# Patient Record
Sex: Female | Born: 1995 | Hispanic: Yes | Marital: Single | Smoking: Never smoker
Health system: Southern US, Community
[De-identification: ages and names within clinical notes are randomized; demographics above are authoritative.]

## PROBLEM LIST (undated history)

## (undated) DIAGNOSIS — Z789 Other specified health status: Secondary | ICD-10-CM

## (undated) HISTORY — PX: NO PAST SURGERIES: SHX2092

## (undated) HISTORY — DX: Other specified health status: Z78.9

---

## 2018-11-22 ENCOUNTER — Telehealth: Payer: Self-pay | Admitting: Family Medicine

## 2018-11-22 NOTE — Telephone Encounter (Signed)
Patient called to get scheduled for an appointment. She was sent the link to get signed up for MyChart. She was also instructed to download the MyChart App. When offered assistance, she said she was sure she could do it without any help.

## 2018-11-28 ENCOUNTER — Telehealth: Payer: Self-pay | Admitting: Family Medicine

## 2018-11-28 ENCOUNTER — Ambulatory Visit (INDEPENDENT_AMBULATORY_CARE_PROVIDER_SITE_OTHER): Payer: Self-pay | Admitting: Emergency Medicine

## 2018-11-28 ENCOUNTER — Encounter: Payer: Self-pay | Admitting: Family Medicine

## 2018-11-28 ENCOUNTER — Other Ambulatory Visit: Payer: Self-pay

## 2018-11-28 DIAGNOSIS — Z789 Other specified health status: Secondary | ICD-10-CM

## 2018-11-28 DIAGNOSIS — Z3201 Encounter for pregnancy test, result positive: Secondary | ICD-10-CM

## 2018-11-28 LAB — POCT PREGNANCY, URINE: Preg Test, Ur: POSITIVE — AB

## 2018-11-28 NOTE — Telephone Encounter (Signed)
Attempted to patient about her appointment on 6/16 @ 2:30. No answer, someone picked up the phone but did not say anything. Attempted again and the same thing happened where no one said anything after picking up the call.

## 2018-11-28 NOTE — Progress Notes (Signed)
Pt here today for pregnancy test after having a positive pregnancy test at home. Pregnancy test today was positive. Pt unsure of LMP. OB ultrasound scheduled for 6/25 @ 10 am and 11 am. Pt made aware of appointments. Medications and allergies reconciled with the patient. A list of OTC medications safe to take during pregnancy given to the patient. Proof of pregnancy letter provided by the front office.

## 2018-12-04 ENCOUNTER — Inpatient Hospital Stay
Admission: RE | Admit: 2018-12-04 | Discharge: 2018-12-04 | Disposition: A | Discharge status: Home / Self Care | Payer: Self-pay | Source: Ambulatory Visit

## 2018-12-05 NOTE — Progress Notes (Signed)
Chart reviewed for nurse visit. Agree with plan of care.   Wende Mott, CNM

## 2018-12-07 ENCOUNTER — Other Ambulatory Visit: Payer: Self-pay

## 2018-12-07 ENCOUNTER — Encounter (HOSPITAL_COMMUNITY): Payer: Self-pay

## 2018-12-07 ENCOUNTER — Ambulatory Visit (HOSPITAL_COMMUNITY)
Admission: RE | Admit: 2018-12-07 | Discharge: 2018-12-07 | Disposition: A | Discharge status: Home / Self Care | Payer: Self-pay | Source: Ambulatory Visit

## 2018-12-07 ENCOUNTER — Ambulatory Visit (HOSPITAL_COMMUNITY)
Admission: RE | Admit: 2018-12-07 | Discharge: 2018-12-07 | Disposition: A | Discharge status: Home / Self Care | Payer: Self-pay | Source: Ambulatory Visit | Attending: Obstetrics and Gynecology | Admitting: Obstetrics and Gynecology

## 2018-12-07 DIAGNOSIS — Z789 Other specified health status: Secondary | ICD-10-CM | POA: Insufficient documentation

## 2018-12-11 ENCOUNTER — Encounter: Payer: Self-pay | Admitting: Emergency Medicine

## 2018-12-11 ENCOUNTER — Other Ambulatory Visit: Payer: Self-pay

## 2018-12-11 ENCOUNTER — Telehealth: Payer: Self-pay | Admitting: Emergency Medicine

## 2018-12-11 DIAGNOSIS — Z3400 Encounter for supervision of normal first pregnancy, unspecified trimester: Secondary | ICD-10-CM | POA: Insufficient documentation

## 2018-12-11 DIAGNOSIS — Z3401 Encounter for supervision of normal first pregnancy, first trimester: Secondary | ICD-10-CM

## 2018-12-11 MED ORDER — PROMETHAZINE HCL 25 MG PO TABS: 25.0000 mg | ORAL_TABLET | Freq: Four times a day (QID) | ORAL | 0 refills | Status: DC | PRN

## 2018-12-11 NOTE — Progress Notes (Signed)
I connected with  Gail [name] on 12/11/18 at 10:40 by telephone and verified that I am speaking with the correct person using two identifiers.   I discussed the limitations, risks, security and privacy concerns of performing an evaluation and management service by telephone and the availability of in person appointments. I also discussed with the patient that there may be a patient responsible charge related to this service. The patient expressed understanding and agreed to proceed.  New OB intake completed. Unable to connect with pt via mychart therefore visit completed via telephone. Patient reports that she has never had a pap smear before. Pt was informed that she can expect a pap smear and labs drawn at first prenatal visit. Patient is working on completing her paperwork for [employer]. Patient complains of nausea. Per protocol prescription for phenergan 25 mg was sent to pt pharmacy. Pt was informed of current visitor restrictions and mask requirements. Pt was also told that throughout her pregnancy there would be both virtual and face-to-face appointments.   [name], RN 12/11/2018  11:33 AM

## 2018-12-11 NOTE — Progress Notes (Signed)
I connected with  Gail [name] on 12/11/18 at 9:40 by telephone and verified that I am speaking with the correct person using two identifiers.   I discussed the limitations, risks, security and privacy concerns of performing an evaluation and management service by telephone and the availability of in person appointments. I also discussed with the patient that there may be a patient responsible charge related to this service. The patient expressed understanding and agreed to proceed.  Patient reports that she has never had a pap smear before. Pt was informed that she can expect a pap smear and labs drawn at first prenatal visit. Patient is working on completing her paperwork for [employer]. Patient complains of nausea. Per protocol prescription for phenergan 25 mg was sent to pt pharmacy.   [name], RN 12/11/2018  11:06 AM

## 2018-12-13 NOTE — Progress Notes (Signed)
I have reviewed this chart and agree with the RN/CMA assessment and management.    Rosealyn Little C Gabrielly Mccrystal, MD, FACOG Attending Physician, [facility]  

## 2019-01-08 ENCOUNTER — Other Ambulatory Visit: Payer: Self-pay

## 2019-01-08 ENCOUNTER — Ambulatory Visit (INDEPENDENT_AMBULATORY_CARE_PROVIDER_SITE_OTHER): Payer: Self-pay | Admitting: Obstetrics & Gynecology

## 2019-01-08 ENCOUNTER — Encounter: Payer: Self-pay | Admitting: Obstetrics & Gynecology

## 2019-01-08 VITALS — BP 109/86 | HR 91 | Temp 98.4°F | Wt 261.9 lb

## 2019-01-08 DIAGNOSIS — Z124 Encounter for screening for malignant neoplasm of cervix: Secondary | ICD-10-CM

## 2019-01-08 DIAGNOSIS — O99211 Obesity complicating pregnancy, first trimester: Secondary | ICD-10-CM

## 2019-01-08 DIAGNOSIS — O99210 Obesity complicating pregnancy, unspecified trimester: Secondary | ICD-10-CM | POA: Insufficient documentation

## 2019-01-08 DIAGNOSIS — Z3401 Encounter for supervision of normal first pregnancy, first trimester: Secondary | ICD-10-CM

## 2019-01-08 DIAGNOSIS — Z113 Encounter for screening for infections with a predominantly sexual mode of transmission: Secondary | ICD-10-CM

## 2019-01-08 DIAGNOSIS — Z3A11 11 weeks gestation of pregnancy: Secondary | ICD-10-CM

## 2019-01-08 NOTE — Progress Notes (Signed)
  Subjective:    Gail [name] is being seen today for her first obstetrical visit.  This is not a planned pregnancy. She is at [redacted]w[redacted]d gestation. Her obstetrical history is significant for obesity. Relationship with FOB: significant other, living together. Patient does intend to breast feed. Pregnancy history fully reviewed.  Patient reports no complaints.  Review of Systems:   Review of Systems  Objective:     BP 109/86   Pulse 91   Temp 98.4 F (36.9 [name])   Wt 261 lb 14.4 oz (118.8 kg)  Physical Exam  Exam Breathing, conversing, and ambulating normally Well nourished, well hydrated Latina, no apparent distress  Abd- benign Cervix- normal, no blood seen at all   Assessment:    Pregnancy: G1P0 Patient Active Problem List   Diagnosis Date Noted  . Obesity in pregnancy 01/08/2019  . Supervision of low-risk first pregnancy 12/11/2018       Plan:     Initial labs drawn. Prenatal vitamins. Problem list reviewed and updated. NIPS and horizon today Role of ultrasound in pregnancy discussed; fetal survey: ordered. Amniocentesis discussed: not indicated. Follow up in 4 weeks, virtual Rec 12 pound weight gain to decrease risk of stillbirth, etc. She has babyscripts and mychart Will get BP cuff when she get medicaid rec buy a scale Check hba1c     Gail [name] Gail [name] 01/08/2019

## 2019-01-09 LAB — OBSTETRIC PANEL, INCLUDING HIV
Antibody Screen: NEGATIVE
Basophils Absolute: 0.1 10*3/uL (ref 0.0–0.2)
Basos: 0 %
EOS (ABSOLUTE): 0.1 10*3/uL (ref 0.0–0.4)
Eos: 1 %
HIV Screen 4th Generation wRfx: NONREACTIVE
Hematocrit: 38.3 % (ref 34.0–46.6)
Hemoglobin: 12.8 g/dL (ref 11.1–15.9)
Hepatitis B Surface Ag: NEGATIVE
Immature Grans (Abs): 0.2 10*3/uL — ABNORMAL HIGH (ref 0.0–0.1)
Immature Granulocytes: 1 %
Lymphocytes Absolute: 3.1 10*3/uL (ref 0.7–3.1)
Lymphs: 21 %
MCH: 26.5 pg — ABNORMAL LOW (ref 26.6–33.0)
MCHC: 33.4 g/dL (ref 31.5–35.7)
MCV: 79 fL (ref 79–97)
Monocytes Absolute: 0.8 10*3/uL (ref 0.1–0.9)
Monocytes: 5 %
Neutrophils Absolute: 10.6 10*3/uL — ABNORMAL HIGH (ref 1.4–7.0)
Neutrophils: 72 %
Platelets: 473 10*3/uL — ABNORMAL HIGH (ref 150–450)
RBC: 4.83 x10E6/uL (ref 3.77–5.28)
RDW: 14.6 % (ref 11.7–15.4)
RPR Ser Ql: NONREACTIVE
Rh Factor: POSITIVE
Rubella Antibodies, IGG: 2.64 index (ref >0.99)
WBC: 14.9 10*3/uL — ABNORMAL HIGH (ref 3.4–10.8)

## 2019-01-09 LAB — HEMOGLOBIN A1C
Est. average glucose Bld gHb Est-mCnc: 111 mg/dL
Hgb A1c MFr Bld: 5.5 % (ref 4.8–5.6)

## 2019-01-10 ENCOUNTER — Encounter: Payer: Self-pay | Admitting: Obstetrics & Gynecology

## 2019-01-10 DIAGNOSIS — R87612 Low grade squamous intraepithelial lesion on cytologic smear of cervix (LGSIL): Secondary | ICD-10-CM | POA: Insufficient documentation

## 2019-01-10 LAB — CYTOLOGY - PAP
Chlamydia: NEGATIVE
Neisseria Gonorrhea: NEGATIVE

## 2019-01-10 LAB — CULTURE, OB URINE

## 2019-01-10 LAB — URINE CULTURE, OB REFLEX

## 2019-01-17 ENCOUNTER — Encounter: Payer: Self-pay | Admitting: *Deleted

## 2019-01-21 ENCOUNTER — Inpatient Hospital Stay (HOSPITAL_COMMUNITY)
Admission: EM | Admit: 2019-01-21 | Discharge: 2019-01-22 | Disposition: A | Discharge status: Home / Self Care | Payer: Self-pay | Attending: Obstetrics and Gynecology | Admitting: Obstetrics and Gynecology

## 2019-01-21 ENCOUNTER — Other Ambulatory Visit: Payer: Self-pay

## 2019-01-21 ENCOUNTER — Encounter (HOSPITAL_COMMUNITY): Payer: Self-pay | Admitting: Emergency Medicine

## 2019-01-21 DIAGNOSIS — Z3A13 13 weeks gestation of pregnancy: Secondary | ICD-10-CM

## 2019-01-21 DIAGNOSIS — O034 Incomplete spontaneous abortion without complication: Secondary | ICD-10-CM | POA: Insufficient documentation

## 2019-01-21 NOTE — ED Triage Notes (Addendum)
Per pt, she is [redacted] weeks pregnant, was having abdominal pains and then "I had to pee on my self."  Baby delivered at home.  Pt last saw her OBGYN in late July.

## 2019-01-21 NOTE — ED Provider Notes (Signed)
[facility] Emergency Department Provider Note MRN:  [id number]  Arrival date & time: 01/22/19     Chief Complaint   delivery   History of Present Illness   Gail [name] is a 23 y.o. year-old female with no pertinent past medical history presenting to the ED with chief complaint of delivery.  Patient explains that she is [redacted] weeks pregnant, first pregnancy.  She is been having some spotting during the pregnancy, was advised that this is normal.  This evening, shortly prior to arrival, she experienced sudden onset cramps, went to the bathroom, noticed some bleeding, and then suddenly passed a [name] fetus.  This caused her considerable distress and she drove here for evaluation.  Her pain is improved, currently mild, her bleeding is mild and slowing.  She otherwise denies fever, no chest pain or shortness of breath, no other complaints.  Review of Systems  A complete 10 system review of systems was obtained and all systems are negative except as noted in the HPI and PMH.   Patient's Health History    Past Medical History:  Diagnosis Date  . Medical history non-contributory   . No pertinent past medical history     Past Surgical History:  Procedure Laterality Date  . NO PAST SURGERIES      Family History  Problem Relation Age of Onset  . Miscarriages / [place] Mother   . Diabetes Maternal Aunt   . Diabetes Maternal Uncle   . Cancer Maternal Grandfather     Social History   Socioeconomic History  . Marital status: Single    Spouse name: Not on file  . Number of children: Not on file  . Years of education: Not on file  . Highest education level: Not on file  Occupational History  . Not on file  Social Needs  . Financial resource strain: Not on file  . Food insecurity    Worry: Not on file    Inability: Not on file  . Transportation needs    Medical: Not on file    Non-medical: Not on file  Tobacco Use  . Smoking status: Never Smoker   Substance and Sexual Activity  . Alcohol use: Not Currently    Frequency: Never  . Drug use: Never  . Sexual activity: Yes    Birth control/protection: None  Lifestyle  . Physical activity    Days per week: Not on file    Minutes per session: Not on file  . Stress: Not on file  Relationships  . Social Musicianconnections    Talks on phone: Not on file    Gets together: Not on file    Attends religious service: Not on file    Active member of club or organization: Not on file    Attends meetings of clubs or organizations: Not on file    Relationship status: Not on file  . Intimate partner violence    Fear of current or ex partner: Not on file    Emotionally abused: Not on file    Physically abused: Not on file    Forced sexual activity: Not on file  Other Topics Concern  . Not on file  Social History Narrative  . Not on file     Physical Exam  Vital Signs and Nursing Notes reviewed Vitals:   01/22/19 0000 01/22/19 0015  BP: 127/71 132/84  Pulse: (!) 104 93  Resp: 18 (!) 21  Temp:    SpO2: 96% 95%  CONSTITUTIONAL: Well-appearing, in moderate emotional distress NEURO:  Alert and oriented x 3, no focal deficits EYES:  eyes equal and reactive ENT/NECK:  no LAD, no JVD CARDIO: Tachycardic rate, well-perfused, normal S1 and S2 PULM:  CTAB no wheezing or rhonchi GI/GU:  normal bowel sounds, non-distended, non-tender; minimal vaginal bleeding MSK/SPINE:  No gross deformities, no edema SKIN:  no rash, atraumatic PSYCH:  Appropriate speech and behavior  Diagnostic and Interventional Summary    Labs Reviewed  CBC  BASIC METABOLIC PANEL  HCG, QUANTITATIVE, PREGNANCY  ABO/RH    No orders to display    Medications - No data to display   Procedures Critical Care  ED Course and Medical Decision Making  I have reviewed the triage vital signs and the nursing notes.  Pertinent labs & imaging results that were available during my care of the patient were reviewed by me and  considered in my medical decision making (see below for details).  Delivery of nonviable fetus in this otherwise healthy 23 year old female.  Abdomen is soft, bleeding appears controlled.  Unsure of gross status, will obtain labs, consult OB/GYN for recommendations regarding placental delivery and/or follow-up.  Discussed with OB, who recommends transfer to the [facility] for further evaluation.  Gail [name], [place] [email or web address]  Final Clinical Impressions(s) / ED Diagnoses     ICD-10-CM   1. Nonviable pregnancy  O02.89   2. Miscarriage  O03.9     ED Discharge Orders    None         Maudie Flakes, MD 01/22/19 Gail [name]

## 2019-01-22 ENCOUNTER — Encounter (HOSPITAL_COMMUNITY): Payer: Self-pay

## 2019-01-22 DIAGNOSIS — O039 Complete or unspecified spontaneous abortion without complication: Secondary | ICD-10-CM | POA: Insufficient documentation

## 2019-01-22 LAB — BASIC METABOLIC PANEL
Anion gap: 15 (ref 5–15)
BUN: 5 mg/dL — ABNORMAL LOW (ref 6–20)
CO2: 19 mmol/L — ABNORMAL LOW (ref 22–32)
Calcium: 9.9 mg/dL (ref 8.9–10.3)
Chloride: 102 mmol/L (ref 98–111)
Creatinine, Ser: 0.64 mg/dL (ref 0.44–1.00)
GFR calc Af Amer: >60 mL/min (ref >60)
GFR calc non Af Amer: >60 mL/min (ref >60)
Glucose, Bld: 91 mg/dL (ref 70–99)
Potassium: 3.9 mmol/L (ref 3.5–5.1)
Sodium: 136 mmol/L (ref 135–145)

## 2019-01-22 LAB — CBC
HCT: 42.9 % (ref 36.0–46.0)
Hemoglobin: 13.8 g/dL (ref 12.0–15.0)
MCH: 26.6 pg (ref 26.0–34.0)
MCHC: 32.2 g/dL (ref 30.0–36.0)
MCV: 82.7 fL (ref 80.0–100.0)
Platelets: 585 10*3/uL — ABNORMAL HIGH (ref 150–400)
RBC: 5.19 MIL/uL — ABNORMAL HIGH (ref 3.87–5.11)
RDW: 14.4 % (ref 11.5–15.5)
WBC: 23.7 10*3/uL — ABNORMAL HIGH (ref 4.0–10.5)
nRBC: 0 % (ref 0.0–0.2)

## 2019-01-22 LAB — HCG, QUANTITATIVE, PREGNANCY: hCG, Beta Chain, Quant, S: 31062 m[IU]/mL — ABNORMAL HIGH (ref <5)

## 2019-01-22 LAB — ABO/RH: ABO/RH(D): A POS

## 2019-01-22 MED ORDER — ONDANSETRON HCL 4 MG/2ML IJ SOLN
4.0000 mg | Freq: Once | INTRAMUSCULAR | Status: AC
  Administered 2019-01-22: 01:00:00 4 mg via INTRAVENOUS
  Filled 2019-01-22: qty 2

## 2019-01-22 MED ORDER — SODIUM CHLORIDE 0.9 % IV BOLUS
1000.0000 mL | Freq: Once | INTRAVENOUS | Status: AC
  Administered 2019-01-22: 1000 mL via INTRAVENOUS

## 2019-01-22 MED ORDER — FENTANYL CITRATE (PF) 100 MCG/2ML IJ SOLN
100.0000 ug | Freq: Once | INTRAMUSCULAR | Status: AC
  Administered 2019-01-22: 100 ug via INTRAVENOUS
  Filled 2019-01-22: qty 2

## 2019-01-22 MED ORDER — MISOPROSTOL 200 MCG PO TABS
600.0000 ug | ORAL_TABLET | Freq: Once | ORAL | Status: AC
  Administered 2019-01-22: 600 ug via BUCCAL
  Filled 2019-01-22: qty 3

## 2019-01-22 MED ORDER — FENTANYL CITRATE (PF) 100 MCG/2ML IJ SOLN
50.0000 ug | Freq: Once | INTRAMUSCULAR | Status: AC
  Administered 2019-01-22: 50 ug via INTRAVENOUS
  Filled 2019-01-22: qty 2

## 2019-01-22 NOTE — MAU Note (Signed)
Patient presents to MAU from [facility] ED. Patient reports having miscarriage at home. Patient states she was at home and started cramping. Patient "went to bathroom. Patient states she felt dizzy and threw up. She stated when she threw up she felt something "plop" and it was the baby."

## 2019-01-22 NOTE — MAU Provider Note (Signed)
History     CSN: [id number]  Arrival date and time: 01/21/19 2326   None     Chief Complaint  Patient presents with  . Miscarriage   Gail [name] is a 23 y.o. G1P0 at [redacted]w[redacted]d who receives care at [facility].  She presents today, as a transfer from [facility], for Vaginal Bleeding s/p Miscarriage.  Patient reports immense cramping and passing of large clots since delivery of fetus.  Patient states the cramping is in her abdomen and radiates to her back.  Patient requests something to drink.      OB History    Gravida  1   Para      Term      Preterm      AB      Living        SAB      TAB      Ectopic      Multiple      Live Births              Past Medical History:  Diagnosis Date  . Medical history non-contributory   . No pertinent past medical history     Past Surgical History:  Procedure Laterality Date  . NO PAST SURGERIES      Family History  Problem Relation Age of Onset  . Miscarriages / [place] Mother   . Diabetes Maternal Aunt   . Diabetes Maternal Uncle   . Cancer Maternal Grandfather     Social History   Tobacco Use  . Smoking status: Never Smoker  Substance Use Topics  . Alcohol use: Not Currently    Frequency: Never  . Drug use: Never    Allergies: No Known Allergies  Medications Prior to Admission  Medication Sig Dispense Refill Last Dose  . folic acid (FOLVITE) 938 MCG tablet Take 400 mcg by mouth daily.     . Prenatal Vit-Fe Fumarate-FA (PRENATAL MULTIVITAMIN) TABS tablet Take 1 tablet by mouth daily at 12 noon.     . promethazine (PHENERGAN) 25 MG tablet Take 1 tablet (25 mg total) by mouth every 6 (six) hours as needed for nausea or vomiting. 30 tablet 0     Review of Systems  Gastrointestinal: Positive for abdominal pain. Negative for nausea and vomiting.  Genitourinary: Positive for vaginal bleeding.   Physical Exam   Blood pressure 138/79, pulse 88, temperature 97.7 F (36.5 C), temperature source  Oral, resp. rate 16, height 5\' 3"  (1.6 m), weight 113.4 kg, SpO2 96 %.  Physical Exam  Constitutional: She is oriented to person, place, and time. She appears well-developed and well-nourished. She appears distressed.  HENT:  Head: Normocephalic and atraumatic.  Eyes: Conjunctivae are normal.  Neck: Normal range of motion.  Cardiovascular: Normal rate.  Respiratory: Effort normal.  GI: Soft.  Genitourinary:    Vaginal bleeding present.  There is bleeding in the vagina.    Genitourinary Comments: Sterile Speculum Exam: -Normal External Genitalia: Non tender,Small clots noted at introitus.  -Vaginal Vault: Large amt blood with large clot ~size of lemon noted and removed with ring forcep. Vault evacuated with laps x 5 and cervix noted with POC at os.  Slowly, gently teased out with ring forcep.  Appears to be placenta; will send to pathology. -Cervix:Pink, no lesions, cysts, or polyps.  Appears open. Scant to small amt active bleeding from os -Bimanual Exam:  Deferred   Musculoskeletal: Normal range of motion.  Neurological: She is alert and oriented to person, place, and  time.  Skin: Skin is warm and dry.  Psychiatric: She has a normal mood and affect. Her behavior is normal.    MAU Course  Procedures Results for orders placed or performed during the hospital encounter of 01/21/19 (from the past 24 hour(s))  CBC     Status: Abnormal   Collection Time: 01/21/19 11:55 PM  Result Value Ref Range   WBC 23.7 (H) 4.0 - 10.5 K/uL   RBC 5.19 (H) 3.87 - 5.11 MIL/uL   Hemoglobin 13.8 12.0 - 15.0 g/dL   HCT 16.142.9 09.636.0 - 04.546.0 %   MCV 82.7 80.0 - 100.0 fL   MCH 26.6 26.0 - 34.0 pg   MCHC 32.2 30.0 - 36.0 g/dL   RDW 40.914.4 81.111.5 - 91.415.5 %   Platelets 585 (H) 150 - 400 K/uL   nRBC 0.0 0.0 - 0.2 %  ABO/Rh     Status: None   Collection Time: 01/21/19 11:55 PM  Result Value Ref Range   ABO/RH(D)      A POS Performed at [facility], [address] [place], [place] [address]   hCG,  quantitative, pregnancy     Status: Abnormal   Collection Time: 01/21/19 11:55 PM  Result Value Ref Range   hCG, Beta Chain, Quant, S 31,062 (H) <5 mIU/mL    MDM [facility]  Assessment and Plan  23 year old, G1P0  Incomplete Abortion at 13.1weeks A Positive  -Exam findings discussed. -Informed that placenta appears to have passed; will send to pathology. -Discussed POC to include monitoring bleeding and if stable will discharge with follow up in 2 weeks at [facility] office.  -Dr. Alysia PennaErvin consulted for evaluation as questionable POC still noted at os and possible administration of cytotec. -Will give fentanyl 100mcg IV for pain. -Nurse instructed to clean patient up for improved monitoring of bleeding.   Cherre RobinsJessica L Weaver [place] MSN, CNM 01/22/2019, 1:24 AM    Reassessment (2:36 AM)  -Dr. Alysia PennaErvin to bedside to evaluate. *Able to remove pieces of placenta and trailing membranes. *Agrees with cytotec dosing. -Initial POC reiterated with patient. Informed of cytotec dosing to assist with bleeding. -Bleeding precautions given. -Questions addressed regarding restrictions; encouraged pelvic rest and tylenol dosing for pain/discomfort. -Informed that after cytotec dosing will give something to drink. -Give Cytotec 600mcg via buccal route.  -Will monitor bleeding and then plan for discharge. -Message sent to [facility] admin staff for modification of ROB appt on Aug 24 to SAB follow up.   Reassessment (3:31 AM)  -Bleeding remains stable. No clots noted on nurse exam. -Encouraged to call or return to MAU if symptoms worsen or with the onset of new symptoms. -Discharged to home in stable condition.  Cherre RobinsJessica L Guerry [place] MSN, CNM

## 2019-01-22 NOTE — Discharge Instructions (Signed)
Miscarriage °A miscarriage is the loss of an unborn baby (fetus) before the 20th week of pregnancy. Most miscarriages happen during the first 3 months of pregnancy. Sometimes, a miscarriage can happen before a woman knows that she is pregnant. °Having a miscarriage can be an emotional experience. If you have had a miscarriage, talk with your health care provider about any questions you may have about miscarrying, the grieving process, and your plans for future pregnancy. °What are the causes? °A miscarriage may be caused by: °· Problems with the genes or chromosomes of the fetus. These problems make it impossible for the baby to develop normally. They are often the result of random errors that occur early in the development of the baby, and are not passed from parent to child (not inherited). °· Infection of the cervix or uterus. °· Conditions that affect hormone balance in the body. °· Problems with the cervix, such as the cervix opening and thinning before pregnancy is at term (cervical insufficiency). °· Problems with the uterus. These may include: °? A uterus with an abnormal shape. °? Fibroids in the uterus. °? Congenital abnormalities. These are problems that were present at birth. °· Certain medical conditions. °· Smoking, drinking alcohol, or using drugs. °· Injury (trauma). °In many cases, the cause of a miscarriage is not known. °What are the signs or symptoms? °Symptoms of this condition include: °· Vaginal bleeding or spotting, with or without cramps or pain. °· Pain or cramping in the abdomen or lower back. °· Passing fluid, tissue, or blood clots from the vagina. °How is this diagnosed? °This condition may be diagnosed based on: °· A physical exam. °· Ultrasound. °· Blood tests. °· Urine tests. °How is this treated? °Treatment for a miscarriage is sometimes not necessary if you naturally pass all the tissue that was in your uterus. If necessary, this condition may be treated with: °· Dilation and  curettage (D&C). This is a procedure in which the cervix is stretched open and the lining of the uterus (endometrium) is scraped. This is done only if tissue from the fetus or placenta remains in the body (incomplete miscarriage). °· Medicines, such as: °? Antibiotic medicine, to treat infection. °? Medicine to help the body pass any remaining tissue. °? Medicine to reduce (contract) the size of the uterus. These medicines may be given if you have a lot of bleeding. °If you have Rh negative blood and your baby was Rh positive, you will need a shot of a medicine called Rh immunoglobulinto protect your future babies from Rh blood problems. "Rh-negative" and "Rh-positive" refer to whether or not the blood has a specific protein found on the surface of red blood cells (Rh factor). °Follow these instructions at home: °Medicines ° °· Take over-the-counter and prescription medicines only as told by your health care provider. °· If you were prescribed antibiotic medicine, take it as told by your health care provider. Do not stop taking the antibiotic even if you start to feel better. °· Do not take NSAIDs, such as aspirin and ibuprofen, unless they are approved by your health care provider. These medicines can cause bleeding. °Activity °· Rest as directed. Ask your health care provider what activities are safe for you. °· Have someone help with home and family responsibilities during this time. °General instructions °· Keep track of the number of sanitary pads you use each day and how soaked (saturated) they are. Write down this information. °· Monitor the amount of tissue or blood clots that   you pass from your vagina. Save any large amounts of tissue for your health care provider to examine.  Do not use tampons, douche, or have sex until your health care provider approves.  To help you and your partner with the process of grieving, talk with your health care provider or seek counseling.  When you are ready, meet with  your health care provider to discuss any important steps you should take for your health. Also, discuss steps you should take to have a healthy pregnancy in the future.  Keep all follow-up visits as told by your health care provider. This is important. Where to find more information  The American Congress of Obstetricians and Gynecologists: www.acog.org  U.[place] Department of Health and Programmer, systems of Womens Health: [place] Contact a health care provider if:  You have a fever or chills.  You have a foul smelling vaginal discharge.  You have more bleeding instead of less. Get help right away if:  You have severe cramps or pain in your back or abdomen.  You pass blood clots or tissue from your vagina that is walnut-sized or larger.  You soak more than 1 regular sanitary pad in an hour.  You become light-headed or weak.  You pass out.  You have feelings of sadness that take over your thoughts, or you have thoughts of hurting yourself. Summary  Most miscarriages happen in the first 3 months of pregnancy. Sometimes miscarriage happens before a woman even knows that she is pregnant.  Follow your health care provider's instruction for home care. Keep all follow-up appointments.  To help you and your partner with the process of grieving, talk with your health care provider or seek counseling. This information is not intended to replace advice given to you by your health care provider. Make sure you discuss any questions you have with your health care provider. Document Released: 11/24/2000 Document Revised: 09/22/2018 Document Reviewed: 07/06/2016 Elsevier Patient Education  2020 [place]. Complicated Grief Grief is a normal response to the death of someone close to you. Feelings of fear, anger, and guilt can affect almost everyone who loses a loved one. It is also common to have symptoms of depression while you are grieving. These include problems with sleep,  loss of appetite, and lack of energy. They may last for weeks or months after a loss. Complicated grief is different from normal grief or depression. Normal grieving involves sadness and feelings of loss, but those feelings get better and heal over time. Complicated grief is a severe type of grief that lasts for a long time, usually for several months to a year or longer. It interferes with your ability to function normally. Complicated grief may require treatment from a mental health care provider. What are the causes? The cause of this condition is not known. It is not clear why some people continue to struggle with grief and others do not. What increases the risk? You are more likely to develop this condition if:  The death of your loved one was sudden or unexpected.  The death of your loved one was due to a violent event.  Your loved one died from suicide.  Your loved one was a child or a young person.  You were very close to your loved one, or you were dependent on him or her.  You have a history of depression or anxiety. What are the signs or symptoms? Symptoms of this condition include:  Feeling disbelief or having a lack of  emotion (numbness).  Being unable to enjoy good memories of your loved one.  Needing to avoid anything or anyone that reminds you of your loved one.  Being unable to stop thinking about the death.  Feeling intense anger or guilt.  Feeling alone and hopeless.  Feeling that your life is meaningless and empty.  Losing the desire to move on with your life. How is this diagnosed? This condition may be diagnosed based on:  Your symptoms. Complicated grief will be diagnosed if you have ongoing symptoms of grief for 6-12 months or longer.  The effect of symptoms on your life. You may be diagnosed with this condition if your symptoms are interfering with your ability to live your life. Your health care provider may recommend that you see a mental health  care provider. Many symptoms of depression are similar to the symptoms of complicated grief. It is important to be evaluated for complicated grief along with other mental health conditions. How is this treated? This condition is most commonly treated with talk therapy. This therapy is offered by a mental health specialist (psychiatrist). During therapy:  You will learn healthy ways to cope with the loss of your loved one.  Your mental health care provider may recommend antidepressant medicines. Follow these instructions at home: Lifestyle   Take care of yourself. ? Eat on a regular basis, and maintain a healthy diet. Eat plenty of fruits, vegetables, lean protein, and whole grains. ? Try to get some exercise each day. Aim for 30 minutes of exercise on most days of the week. ? Keep a consistent sleep schedule. Try to get 8 or more hours of sleep each night. ? Start doing the things that you used to enjoy.  Do not use drugs or alcohol to ease your symptoms.  Spend time with friends and loved ones. General instructions  Take over-the-counter and prescription medicines only as told by your health care provider.  Consider joining a grief (bereavement) support group to help you deal with your loss.  Keep all follow-up visits as told by your health care provider. This is important. Contact a health care provider if:  Your symptoms prevent you from functioning normally.  Your symptoms do not get better with treatment. Get help right away if:  You have serious thoughts about hurting yourself or someone else.  You have suicidal feelings. If you ever feel like you may hurt yourself or others, or have thoughts about taking your own life, get help right away. You can go to your nearest emergency department or call:  Your local emergency services (911 in the [place]  A suicide crisis helpline, such as the National Suicide Prevention Lifeline at (857)115-93301-(201)315-3296. This is open 24 hours a  day. Summary  Complicated grief is a severe type of grief that lasts for a long time. This grief is not likely to go away on its own. Get the help you need.  Some griefs are more difficult than others and can cause this condition. You may need a certain type of treatment to help you recover if the loss of your loved one was sudden, violent, or due to suicide.  You may feel guilty about moving on with your life. Getting help does not mean that you are forgetting your loved one. It means that you are taking care of yourself.  Complicated grief is best treated with talk therapy. Medicines may also be prescribed.  Seek the help you need, and find support that will help you recover.  This information is not intended to replace advice given to you by your health care provider. Make sure you discuss any questions you have with your health care provider. Document Released: 05/31/2005 Document Revised: 05/13/2017 Document Reviewed: 03/16/2017 Elsevier Patient Education  2020 [employer]

## 2019-01-22 NOTE — ED Notes (Signed)
Informed provider that pt is vomiting and has blood clots. Provider bedside.

## 2019-01-24 NOTE — Progress Notes (Signed)
POC idenitifed

## 2019-01-25 ENCOUNTER — Telehealth: Payer: Self-pay

## 2019-01-25 NOTE — Telephone Encounter (Signed)
Pt called Nurse line this am @ 10:37a regarding cont. Bleeding, blood clots, a lot of pain after ER visit for SAB. Pt did not answer, left VM stating medication given at er visit is causing all the symptoms & to use either Ibuprofen ar Tylenol for Pain. Advised to call back if have further questions.

## 2019-01-26 ENCOUNTER — Encounter (HOSPITAL_COMMUNITY): Payer: Self-pay

## 2019-01-26 ENCOUNTER — Other Ambulatory Visit: Payer: Self-pay

## 2019-01-26 ENCOUNTER — Inpatient Hospital Stay (HOSPITAL_COMMUNITY): Payer: Self-pay

## 2019-01-26 ENCOUNTER — Inpatient Hospital Stay (HOSPITAL_COMMUNITY)
Admission: EM | Admit: 2019-01-26 | Discharge: 2019-01-26 | Disposition: A | Discharge status: Home / Self Care | Payer: Self-pay | Attending: Obstetrics & Gynecology | Admitting: Obstetrics & Gynecology

## 2019-01-26 DIAGNOSIS — O039 Complete or unspecified spontaneous abortion without complication: Secondary | ICD-10-CM | POA: Insufficient documentation

## 2019-01-26 DIAGNOSIS — Z3A13 13 weeks gestation of pregnancy: Secondary | ICD-10-CM | POA: Insufficient documentation

## 2019-01-26 LAB — URINALYSIS, ROUTINE W REFLEX MICROSCOPIC
Bilirubin Urine: NEGATIVE
Glucose, UA: NEGATIVE mg/dL
Ketones, ur: NEGATIVE mg/dL
Nitrite: NEGATIVE
Protein, ur: NEGATIVE mg/dL
Specific Gravity, Urine: 1.021 (ref 1.005–1.030)
pH: 5 (ref 5.0–8.0)

## 2019-01-26 MED ORDER — MISOPROSTOL 200 MCG PO TABS
800.0000 ug | ORAL_TABLET | Freq: Once | ORAL | Status: AC
  Administered 2019-01-26: 800 ug via BUCCAL
  Filled 2019-01-26: qty 4

## 2019-01-26 NOTE — ED Notes (Signed)
Called [facility] to inquire if this pt should stay in ER or [facility]- [facility] accepts pt.

## 2019-01-26 NOTE — [facility] Provider Note (Signed)
Chief Complaint: Abdominal Pain and Vaginal Bleeding   First Provider Initiated Contact with Patient 01/26/19 0912     SUBJECTIVE HPI: Gail [name] is a 23 y.o. G1P0 at 6440w5d who presents [facility] reporting vaginal bleeding & abdominal pain. Was seen in [facility] Sunday night for a miscarriage. She reports delivering the baby at home and in [facility] placenta was removed. Patient was given dose of cytotec before discharge. States she has continued to have bright red bleeding & passing some small clots. Is not saturating pads. Also reports lower abdominal cramping, low back pain, & rectal pressure. No fever/chills.   Location: abdomen/back Quality: cramping Severity: 4/10 on pain scale Duration: 4 days Timing: intermittent Modifying factors: not improved with tylenol Associated signs and symptoms: vaginal bleeding  Past Medical History:  Diagnosis Date  . No pertinent past medical history    OB History  Gravida Para Term Preterm AB Living  1            SAB TAB Ectopic Multiple Live Births               # Outcome Date GA Lbr Len/2nd Weight Sex Delivery Anes PTL Lv  1 Current            Past Surgical History:  Procedure Laterality Date  . NO PAST SURGERIES     Social History   Socioeconomic History  . Marital status: Single    Spouse name: Not on file  . Number of children: Not on file  . Years of education: Not on file  . Highest education level: Not on file  Occupational History  . Not on file  Social Needs  . Financial resource strain: Not on file  . Food insecurity    Worry: Not on file    Inability: Not on file  . Transportation needs    Medical: Not on file    Non-medical: Not on file  Tobacco Use  . Smoking status: Never Smoker  . Smokeless tobacco: Never Used  Substance and Sexual Activity  . Alcohol use: Not Currently    Frequency: Never  . Drug use: Never  . Sexual activity: Yes    Birth control/protection: None  Lifestyle  . Physical  activity    Days per week: Not on file    Minutes per session: Not on file  . Stress: Not on file  Relationships  . Social Musicianconnections    Talks on phone: Not on file    Gets together: Not on file    Attends religious service: Not on file    Active member of club or organization: Not on file    Attends meetings of clubs or organizations: Not on file    Relationship status: Not on file  . Intimate partner violence    Fear of current or ex partner: Not on file    Emotionally abused: Not on file    Physically abused: Not on file    Forced sexual activity: Not on file  Other Topics Concern  . Not on file  Social History Narrative  . Not on file   Family History  Problem Relation Age of Onset  . Miscarriages / [place] Mother   . Diabetes Maternal Aunt   . Diabetes Maternal Uncle   . Cancer Maternal Grandfather    No current facility-administered medications on file prior to encounter.    No current outpatient medications on file prior to encounter.   No Known Allergies  I have reviewed  patient's Past Medical Hx, Surgical Hx, Family Hx, Social Hx, medications and allergies.   Review of Systems  Constitutional: Negative.   Gastrointestinal: Positive for abdominal pain.  Genitourinary: Positive for vaginal bleeding.    OBJECTIVE Patient Vitals for the past 24 hrs:  BP Temp Temp src Pulse Resp Height Weight  01/26/19 1322 (!) 143/92 - - - 18 - -  01/26/19 0900 130/90 - - - - - -  01/26/19 0819 (!) 149/84 98.6 F (37 C) Oral 87 18 5\' 3"  (1.6 m) 119.8 kg   Constitutional: Well-developed, well-nourished female in no acute distress.  Cardiovascular: normal rate & rhythm, no murmur Respiratory: normal rate and effort. Lung sounds clear throughout GI: Abd soft, non-tender, Pos BS x 4. No guarding or rebound tenderness MS: Extremities nontender, no edema, normal ROM Neurologic: Alert and oriented x 4.     LAB RESULTS Results for orders placed or performed during the  hospital encounter of 01/26/19 (from the past 24 hour(s))  Urinalysis, Routine w reflex microscopic     Status: Abnormal   Collection Time: 01/26/19 11:02 AM  Result Value Ref Range   Color, Urine YELLOW YELLOW   APPearance HAZY (A) CLEAR   Specific Gravity, Urine 1.021 1.005 - 1.030   pH 5.0 5.0 - 8.0   Glucose, UA NEGATIVE NEGATIVE mg/dL   Hgb urine dipstick LARGE (A) NEGATIVE   Bilirubin Urine NEGATIVE NEGATIVE   Ketones, ur NEGATIVE NEGATIVE mg/dL   Protein, ur NEGATIVE NEGATIVE mg/dL   Nitrite NEGATIVE NEGATIVE   Leukocytes,Ua SMALL (A) NEGATIVE   RBC / HPF 11-20 0 - 5 RBC/hpf   WBC, UA 21-50 0 - 5 WBC/hpf   Bacteria, UA RARE (A) NONE SEEN   Squamous Epithelial / LPF 6-10 0 - 5   Mucus PRESENT    Ca Oxalate Crys, UA PRESENT     IMAGING US Pelvic Complete With Transvaginal  Result Date: 01/26/2019 CLINICAL DATA:  Continued bleeding after spontaneous abortion five days ago. EXAM: TRANSABDOMINAL AND TRANSVAGINAL ULTRASOUND OF PELVIS TECHNIQUE: Both transabdominal and transvaginal ultrasound examinations of the pelvis were performed. Transabdominal technique was performed for global imaging of the pelvis including uterus, ovaries, adnexal regions, and pelvic cul-de-sac. It was necessary to proceed with endovaginal exam following the transabdominal exam to visualize the endometrium and ovaries. COMPARISON:  None FINDINGS: Uterus Measurements: 9.4 x 4.7 x 6.6 cm = volume: 152.7 mL. No fibroids or other mass visualized. Endometrium Thickness: 16 mm.  Thickened heterogeneous vascular endometrium. Right ovary Measurements: 2.4 x 1.9 x 2.2 cm = volume: 5.3 mL. Normal appearance/no adnexal mass. Left ovary Measurements: 2.7 x 2.3 x 2.1 cm = volume: 6.7 mL. Normal appearance/no adnexal mass. Other findings No abnormal free fluid. IMPRESSION: 1. Thickened heterogeneous vascular endometrium measuring 16 mm in thickness is identified. Sonographic findings are compatible with retained products of  conception in the correct clinical setting. Electronically Signed   By: Dorise Bullion III M.D   On: 01/26/2019 10:53    [facility] COURSE Orders Placed This Encounter  Procedures  . Culture, OB Urine  . US PELVIC COMPLETE WITH TRANSVAGINAL  . Urinalysis, Routine w reflex microscopic  . Discharge patient   Meds ordered this encounter  Medications  . misoprostol (CYTOTEC) tablet 800 mcg    MDM Ultrasound shows ET of 16 mm. Some heterogeneous vascular endometrium. C/w Dr. Harolyn Rutherford, will offer dose of cytotec.   RH positive  Patient given cytotec prior to discharge. Discussed results of ultrasound & reasons to  return to [facility]U. Pt has SAB f/u appointment scheduled for 8/24  ASSESSMENT 1. Miscarriage     PLAN Discharge home in stable condition. Bleeding/infection precautions Pelvic rest Urine culture pending  Follow-up Information    [facility] Follow up.   Specialty: Obstetrics and Gynecology Why: return for worsening symptoms Contact information: [address] [id number] [place] [id number] [phone]         Allergies as of 01/26/2019   No Known Allergies     Medication List    You have not been prescribed any medications.      Judeth HornLawrence, Joban Colledge, NP 01/26/2019  4:16 PM

## 2019-01-26 NOTE — Progress Notes (Signed)
I offered grief support to [place] as she continues to process the loss of her baby.  She shared about the delivery and the days that have followed made good use of the time to ask questions about typical grief.  This was a first baby for her and her SO and a first grandchild for her parents.  I provided her with resources and information for follow up grief support.  [place], Bcc Pager, [phone] 12:20 PM

## 2019-01-26 NOTE — MAU Note (Signed)
miscarriage Sunday night (at home) 13 weeks (brought fetus in with her and because providers could not give her an answer of why the miscarriage she took the fetus back home.(fetus is in a box at home)  Came here to [facility] Sunday night d/c @ 4am Monday morning. Still in a Lot of pain since miscarriage (rectual pressure, back pain, abd pain, pelvic pain) still bleeding and was neve offered an U/S. Would like to have U/S to make sure she have passed everything (dont want anything to be left inside to cause infection and mess up chance of getting pregnant again. Want to be exam to make sure she have passed everything. Pt. Is very upset.

## 2019-01-26 NOTE — Discharge Instructions (Signed)
Return to care  °· If you have heavier bleeding that soaks through more that 2 pads per hour for an hour or more °· If you bleed so much that you feel like you might pass out or you do pass out °· If you have significant abdominal pain that is not improved with Tylenol  °· If you develop a fever > 100.5 ° ° ° °Managing Pregnancy Loss °Pregnancy loss can happen any time during a pregnancy. Often the cause is not known. It is rarely because of anything you did. Pregnancy loss in early pregnancy (during the first trimester) is called a miscarriage. This type of pregnancy loss is the most common. Pregnancy loss that happens after 20 weeks of pregnancy is called fetal demise if the baby's heart stops beating before birth. Fetal demise is much less common. Some women experience spontaneous labor shortly after fetal demise resulting in a stillborn birth (stillbirth). °Any pregnancy loss can be devastating. You will need to recover both physically and emotionally. Most women are able to get pregnant again after a pregnancy loss and deliver a healthy baby. °How to manage emotional recovery ° °Pregnancy loss is very hard emotionally. You may feel many different emotions while you grieve. You may feel sad and angry. You may also feel guilty. It is normal to have periods of crying. Emotional recovery can take longer than physical recovery. It is different for everyone. °Taking these steps can help you in managing this loss: °· Remember that it is unlikely you did anything to cause the pregnancy loss. °· Share your thoughts and feelings with friends, family, and your partner. Remember that your partner is also recovering emotionally. °· Make sure you have a good support system. Do not spend too much time alone. °· Meet with a pregnancy loss counselor or join a pregnancy loss support group. °· Get enough sleep and eat a healthy diet. Return to regular exercise when you have recovered physically. °· Do not use drugs or alcohol to  manage your emotions. °· Consider seeing a mental health professional to help you recover emotionally. °· Ask a friend or loved one to help you decide what to do with any clothing and nursery items you received for your baby. °In the case of a stillbirth, many women benefit from taking additional steps in the grieving process. You may want to: °· Hold your baby after the birth. °· Name your baby. °· Request a birth certificate. °· Create a keepsake such as handprints or footprints. °· Dress your baby and have a picture taken. °· Make funeral arrangements. °· Ask for a baptism or blessing. °Hospitals have staff members who can help you with all these arrangements. °How to recognize emotional stress °It is normal to have emotional stress after a pregnancy loss. But emotional stress that lasts a long time or becomes severe requires treatment. Watch out for these signs of severe emotional stress: °· Sadness, anger, or guilt that is not going away and is interfering with your normal activities. °· Relationship problems that have occurred or gotten worse since the pregnancy loss. °· Signs of depression that last longer than 2 weeks. These may include: °? Sadness. °? Anxiety. °? Hopelessness. °? Loss of interest in activities you enjoy. °? Inability to concentrate. °? Trouble sleeping or sleeping too much. °? Loss of appetite or overeating. °? Thoughts of death or of hurting yourself. °Follow these instructions at home: °· Take over-the-counter and prescription medicines only as told by your health care   provider. °· Rest at home until your energy level returns. Return to your normal activities as told by your health care provider. Ask your health care provider what activities are safe for you. °· When you are ready, meet with your health care provider to discuss steps to take for a future pregnancy. °· Keep all follow-up visits as told by your health care provider. This is important. °Where to find support °· To help you  and your partner with the process of grieving, talk with your health care provider or seek counseling. °· Consider meeting with others who have experienced pregnancy loss. Ask your health care provider about support groups and resources. °Where to find more information °· [place] Department of Health and Human Services Office on Women's Health: www.womenshealth.gov °· American Pregnancy Association: www.americanpregnancy.org °Contact a health care provider if: °· You continue to experience grief, sadness, or lack of motivation for everyday activities, and those feelings do not improve over time. °· You are struggling to recover emotionally, especially if you are using alcohol or substances to help. °Get help right away if: °· You have thoughts of hurting yourself or others. °If you ever feel like you may hurt yourself or others, or have thoughts about taking your own life, get help right away. You can go to your nearest emergency department or call: °· Your local emergency services (911 in the [place] °· A suicide crisis helpline, such as the National Suicide Prevention Lifeline at 1-800-273-8255. This is open 24 hours a day. °Summary °· Any pregnancy loss can be difficult physically and emotionally. °· You may experience many different emotions while you grieve. Emotional recovery can last longer than physical recovery. °· It is normal to have emotional stress after a pregnancy loss. But emotional stress that lasts a long time or becomes severe requires treatment. °· See your health care provider if you are struggling emotionally after a pregnancy loss. °This information is not intended to replace advice given to you by your health care provider. Make sure you discuss any questions you have with your health care provider. °Document Released: 08/11/2017 Document Revised: 09/20/2018 Document Reviewed: 08/11/2017 °Elsevier Patient Education © 2020 [employer]. ° °

## 2019-01-27 LAB — CULTURE, OB URINE

## 2019-02-05 ENCOUNTER — Ambulatory Visit (INDEPENDENT_AMBULATORY_CARE_PROVIDER_SITE_OTHER): Payer: Self-pay | Admitting: Family Medicine

## 2019-02-05 ENCOUNTER — Encounter: Payer: Self-pay | Admitting: Family Medicine

## 2019-02-05 ENCOUNTER — Other Ambulatory Visit: Payer: Self-pay

## 2019-02-05 ENCOUNTER — Telehealth: Payer: Self-pay | Admitting: Obstetrics & Gynecology

## 2019-02-05 VITALS — BP 137/96 | HR 96 | Wt 276.0 lb

## 2019-02-05 DIAGNOSIS — O039 Complete or unspecified spontaneous abortion without complication: Secondary | ICD-10-CM

## 2019-02-05 NOTE — Progress Notes (Signed)
9th-17th bled  19th bled after orgasm (no penetration)

## 2019-02-05 NOTE — Progress Notes (Signed)
   Subjective:    Patient ID: Gail [name], female    DOB: Oct 17, 1995, 23 y.o.   MRN: [id number]  HPI  Patient seen for follow up after SAB at 13-14 weeks. Had complete SAB. Bleeding has stopped. Grieving, but able to take care of self. No HI/SI.  Review of Systems     Objective:   Physical Exam Vitals signs reviewed.  Constitutional:      Appearance: Normal appearance.  HENT:     Head: Normocephalic and atraumatic.  Cardiovascular:     Rate and Rhythm: Normal rate and regular rhythm.     Pulses: Normal pulses.     Heart sounds: Normal heart sounds.  Pulmonary:     Effort: Pulmonary effort is normal.     Breath sounds: Normal breath sounds.  Skin:    Capillary Refill: Capillary refill takes less than 2 seconds.  Neurological:     Mental Status: She is alert.       Assessment & Plan:  1. SAB (spontaneous abortion) Discussed not getting pregnant over next 3 months. Continue PNV.

## 2019-02-06 NOTE — BH Specialist Note (Signed)
Pt did not arrive to video visit and did not answer the phone; Left HIPPA-compliant message to call back [name] from [facility] [employer] [facility] at [phone], and left MyChart message for patient.   [place] via Telemedicine Video Visit  02/06/2019 Gail [name] [phone]  [name]

## 2019-02-07 ENCOUNTER — Encounter: Payer: Self-pay | Admitting: Family Medicine

## 2019-02-07 ENCOUNTER — Other Ambulatory Visit: Payer: Self-pay

## 2019-02-07 ENCOUNTER — Telehealth: Payer: Self-pay | Admitting: Clinical

## 2019-02-07 DIAGNOSIS — Z91199 Patient's noncompliance with other medical treatment and regimen due to unspecified reason: Secondary | ICD-10-CM

## 2019-02-07 DIAGNOSIS — Z5329 Procedure and treatment not carried out because of patient's decision for other reasons: Secondary | ICD-10-CM

## 2019-03-07 ENCOUNTER — Ambulatory Visit (HOSPITAL_COMMUNITY): Payer: Self-pay

## 2019-11-28 ENCOUNTER — Ambulatory Visit (INDEPENDENT_AMBULATORY_CARE_PROVIDER_SITE_OTHER)
Admission: RE | Admit: 2019-11-28 | Discharge: 2019-11-28 | Disposition: A | Discharge status: Home / Self Care | Payer: Self-pay | Source: Ambulatory Visit

## 2019-11-28 ENCOUNTER — Other Ambulatory Visit: Payer: Self-pay

## 2019-11-28 DIAGNOSIS — H1031 Unspecified acute conjunctivitis, right eye: Secondary | ICD-10-CM

## 2019-11-28 MED ORDER — POLYMYXIN B-TRIMETHOPRIM 10000-0.1 UNIT/ML-% OP SOLN: 1.0000 [drp] | Freq: Four times a day (QID) | OPHTHALMIC | 0 refills | Status: AC

## 2019-11-28 NOTE — ED Provider Notes (Signed)
Virtual Visit via Video Note:  Gail [name]  initiated request for Telemedicine visit with [facility] team. I connected with Gail [name]  on 11/28/2019 at 9:48 AM  for a synchronized telemedicine visit using a video enabled HIPPA compliant telemedicine application. I verified that I am speaking with Gail [name]  using two identifiers. Mickie Bail, NP  was physically located in a [facility] Urgent care site and Manie Bealer was located at a different location.   The limitations of evaluation and management by telemedicine as well as the availability of in-person appointments were discussed. Patient was informed that she  may incur a bill ( including co-pay) for this virtual visit encounter. Gail [name]  expressed understanding and gave verbal consent to proceed with virtual visit.     History of Present Illness:Gail [name]  is a 24 y.o. female presents for evaluation of right eye crusting, redness, and drainage x 1 day.  She got lash extensions 3 days ago but she has had these before.  No injury.  No eye pain or changes in vision.  No fever or chills.  She denies current pregnancy or breastfeeding.      No Known Allergies   Past Medical History:  Diagnosis Date  . No pertinent past medical history      Social History   Tobacco Use  . Smoking status: Never Smoker  . Smokeless tobacco: Never Used  Vaping Use  . Vaping Use: Never used  Substance Use Topics  . Alcohol use: Not Currently  . Drug use: Never    ROS: as stated in HPI.  All other systems reviewed and negative.       Observations/Objective: Physical Exam  VITALS: Patient denies fever. GENERAL: Alert, appears well and in no acute distress. HEENT: Atraumatic. Oral mucosa appears moist.  Right eye has crusting along lower lid and inner canthus.  NECK: Normal movements of the head and neck. CARDIOPULMONARY: No increased WOB.  Speaking in clear sentences. I:E ratio WNL.  MS: Moves all visible extremities without noticeable abnormality. PSYCH: Pleasant and cooperative, well-groomed. Speech normal rate and rhythm. Affect is appropriate. Insight and judgement are appropriate. Attention is focused, linear, and appropriate.  NEURO: CN grossly intact. Oriented as arrived to appointment on time with no prompting. Moves both UE equally.  SKIN: No obvious lesions, wounds, erythema, or cyanosis noted on face or hands.   Assessment and Plan:    ICD-10-CM   1. Acute bacterial conjunctivitis of right eye  H10.31        Follow Up Instructions: Treating with Polytrim eyedrops.  Education provided about bacterial conjunctivitis.  Instructed patient to go to the ED if she has acute eye pain or changes in her vision.  Instructed her to follow-up with her eye care provider if her symptoms are not improving.  Patient agrees to plan of care.    I discussed the assessment and treatment plan with the patient. The patient was provided an opportunity to ask questions and all were answered. The patient agreed with the plan and demonstrated an understanding of the instructions.   The patient was advised to call back or seek an in-person evaluation if the symptoms worsen or if the condition fails to improve as anticipated.      Mickie Bail, NP  11/28/2019 9:48 AM         Mickie Bail, NP 11/28/19 [phone]

## 2019-11-28 NOTE — Discharge Instructions (Signed)
Use the antibiotic eyedrops as prescribed.    Follow-up with your eye doctor for a recheck in 1 to 2 days if your symptoms are not improving.    Go to the emergency department if you have acute eye pain or changes in your vision.    

## 2020-08-28 ENCOUNTER — Other Ambulatory Visit: Payer: Self-pay

## 2020-08-28 ENCOUNTER — Emergency Department (HOSPITAL_COMMUNITY): Payer: Self-pay

## 2020-08-28 ENCOUNTER — Encounter (HOSPITAL_COMMUNITY): Payer: Self-pay

## 2020-08-28 ENCOUNTER — Emergency Department (HOSPITAL_COMMUNITY)
Admission: EM | Admit: 2020-08-28 | Discharge: 2020-08-28 | Disposition: A | Discharge status: Home / Self Care | Payer: Self-pay | Attending: Emergency Medicine | Admitting: Emergency Medicine

## 2020-08-28 ENCOUNTER — Ambulatory Visit (HOSPITAL_COMMUNITY)
Admission: EM | Admit: 2020-08-28 | Discharge: 2020-08-28 | Disposition: A | Discharge status: Home / Self Care | Payer: Self-pay

## 2020-08-28 DIAGNOSIS — J3503 Chronic tonsillitis and adenoiditis: Secondary | ICD-10-CM | POA: Insufficient documentation

## 2020-08-28 DIAGNOSIS — J029 Acute pharyngitis, unspecified: Secondary | ICD-10-CM

## 2020-08-28 DIAGNOSIS — J3502 Chronic adenoiditis: Secondary | ICD-10-CM

## 2020-08-28 DIAGNOSIS — Z20822 Contact with and (suspected) exposure to covid-19: Secondary | ICD-10-CM | POA: Insufficient documentation

## 2020-08-28 DIAGNOSIS — J36 Peritonsillar abscess: Secondary | ICD-10-CM | POA: Insufficient documentation

## 2020-08-28 LAB — POC SARS CORONAVIRUS 2 AG -  ED: SARS Coronavirus 2 Ag: NEGATIVE

## 2020-08-28 LAB — CBC WITH DIFFERENTIAL/PLATELET
Abs Immature Granulocytes: 0.08 10*3/uL — ABNORMAL HIGH (ref 0.00–0.07)
Basophils Absolute: 0.1 10*3/uL (ref 0.0–0.1)
Basophils Relative: 1 %
Eosinophils Absolute: 0 10*3/uL (ref 0.0–0.5)
Eosinophils Relative: 0 %
HCT: 42 % (ref 36.0–46.0)
Hemoglobin: 13.5 g/dL (ref 12.0–15.0)
Immature Granulocytes: 1 %
Lymphocytes Relative: 23 %
Lymphs Abs: 2.9 10*3/uL (ref 0.7–4.0)
MCH: 25.7 pg — ABNORMAL LOW (ref 26.0–34.0)
MCHC: 32.1 g/dL (ref 30.0–36.0)
MCV: 79.8 fL — ABNORMAL LOW (ref 80.0–100.0)
Monocytes Absolute: 0.9 10*3/uL (ref 0.1–1.0)
Monocytes Relative: 7 %
Neutro Abs: 8.8 10*3/uL — ABNORMAL HIGH (ref 1.7–7.7)
Neutrophils Relative %: 68 %
Platelets: 440 10*3/uL — ABNORMAL HIGH (ref 150–400)
RBC: 5.26 MIL/uL — ABNORMAL HIGH (ref 3.87–5.11)
RDW: 13.7 % (ref 11.5–15.5)
WBC: 12.7 10*3/uL — ABNORMAL HIGH (ref 4.0–10.5)
nRBC: 0 % (ref 0.0–0.2)

## 2020-08-28 LAB — BASIC METABOLIC PANEL
Anion gap: 9 (ref 5–15)
BUN: 6 mg/dL (ref 6–20)
CO2: 27 mmol/L (ref 22–32)
Calcium: 9.1 mg/dL (ref 8.9–10.3)
Chloride: 97 mmol/L — ABNORMAL LOW (ref 98–111)
Creatinine, Ser: 0.62 mg/dL (ref 0.44–1.00)
GFR, Estimated: >60 mL/min (ref >60)
Glucose, Bld: 101 mg/dL — ABNORMAL HIGH (ref 70–99)
Potassium: 3.4 mmol/L — ABNORMAL LOW (ref 3.5–5.1)
Sodium: 133 mmol/L — ABNORMAL LOW (ref 135–145)

## 2020-08-28 LAB — POC URINE PREG, ED: Preg Test, Ur: NEGATIVE

## 2020-08-28 LAB — POCT RAPID STREP A, ED / UC: Streptococcus, Group A Screen (Direct): NEGATIVE

## 2020-08-28 MED ORDER — CLINDAMYCIN HCL 150 MG PO CAPS: 450.0000 mg | ORAL_CAPSULE | Freq: Three times a day (TID) | ORAL | 0 refills | Status: AC

## 2020-08-28 MED ORDER — IOHEXOL 300 MG/ML  SOLN
75.0000 mL | Freq: Once | INTRAMUSCULAR | Status: AC | PRN
  Administered 2020-08-28: 75 mL via INTRAVENOUS

## 2020-08-28 MED ORDER — CLINDAMYCIN PHOSPHATE 600 MG/50ML IV SOLN: 600.0000 mg | Freq: Once | INTRAVENOUS | Status: DC

## 2020-08-28 MED ORDER — CLINDAMYCIN PHOSPHATE 900 MG/50ML IV SOLN
900.0000 mg | Freq: Once | INTRAVENOUS | Status: AC
  Administered 2020-08-28: 900 mg via INTRAVENOUS
  Filled 2020-08-28: qty 50

## 2020-08-28 MED ORDER — LACTATED RINGERS IV BOLUS
1000.0000 mL | Freq: Once | INTRAVENOUS | Status: AC
  Administered 2020-08-28: 1000 mL via INTRAVENOUS

## 2020-08-28 MED ORDER — DEXAMETHASONE SODIUM PHOSPHATE 10 MG/ML IJ SOLN
10.0000 mg | Freq: Once | INTRAMUSCULAR | Status: AC
  Administered 2020-08-28: 10 mg via INTRAVENOUS
  Filled 2020-08-28: qty 1

## 2020-08-28 MED ORDER — KETOROLAC TROMETHAMINE 60 MG/2ML IM SOLN
60.0000 mg | Freq: Once | INTRAMUSCULAR | Status: AC
  Administered 2020-08-28: 60 mg via INTRAMUSCULAR
  Filled 2020-08-28: qty 2

## 2020-08-28 NOTE — ED Triage Notes (Signed)
Pt presents with sore throat, white patches in  the throat , body aches x 4-5 days. Reports she had a virtual visit this morning and was told to be seeing for Strep test. OTC meds gives no relief.

## 2020-08-28 NOTE — ED Provider Notes (Signed)
[facility] EMERGENCY DEPARTMENT Provider Note   CSN: [id number] Arrival date & time: 08/28/20  1115  History No chief complaint on file.   Gail [name] is a 25 y.o. female.   Sore Throat This is a new problem. The current episode started more than 2 days ago. The problem occurs constantly. The problem has been gradually worsening. Pertinent negatives include no chest pain, no abdominal pain and no shortness of breath. The symptoms are aggravated by swallowing. Nothing relieves the symptoms. She has tried acetaminophen for the symptoms. The treatment provided no relief.   Denies any pertinent medical history.  She presents with sore throat.  She states that she was just seen at urgent care just prior to arrival here.  She has had a sore throat for about 4 days.  She states it is progressively gotten worse.  Currently, it is very difficult to swallow however she does not have any trouble tolerating her secretions.  No history of peritonsillar abscess in the past.  Has never had a tonsillectomy.  Denies fevers or chills.  Denies shortness of breath or chest pain.  She denies any new rashes, coughing, vomiting, or diarrhea.  Tried over-the-counter medication without any improvement.    Past Medical History:  Diagnosis Date  . No pertinent past medical history     Patient Active Problem List   Diagnosis Date Noted  . 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 NO PAST SURGERIES       OB History    Gravida  1   Para      Term      Preterm      AB      Living        SAB      IAB      Ectopic      Multiple      Live Births              Family History  Problem Relation Age of Onset  . Miscarriages / [place] Mother   . Diabetes Maternal Aunt   . Diabetes Maternal Uncle   . Cancer Maternal Grandfather     Social History   Tobacco  Use  . Smoking status: Never Smoker  . Smokeless tobacco: Never Used  Vaping Use  . Vaping Use: Never used  Substance Use Topics  . Alcohol use: Not Currently  . Drug use: Never    Home Medications Prior to Admission medications   Medication Sig Start Date End Date Taking? Authorizing Provider  clindamycin (CLEOCIN) 150 MG capsule Take 3 capsules (450 mg total) by mouth 3 (three) times daily for 10 days. 08/28/20 09/07/20 Yes Kugler, [place], MD  Multiple Vitamin (MULTIVITAMIN) tablet Take 1 tablet by mouth daily.    [provider]    Allergies    Patient has no known allergies.  Review of Systems   Review of Systems  Constitutional: Negative for chills and fever.  HENT: Positive for sore throat. Negative for ear pain and voice change.   Eyes: Negative for pain and visual disturbance.  Respiratory: Negative for cough and shortness of breath.   Cardiovascular: Negative for chest pain and palpitations.  Gastrointestinal: Negative for abdominal pain and vomiting.  Genitourinary: Negative for dysuria and hematuria.  Musculoskeletal: Negative for arthralgias and back pain.  Skin: Negative for color change and rash.  Neurological: Negative  for seizures and syncope.  All other systems reviewed and are negative.   Physical Exam Updated Vital Signs BP 129/81 (BP Location: Right Arm)   Pulse 80   Temp 99.4 F (37.4 C)   Resp 16   LMP 08/13/2020   SpO2 98%   Physical Exam Vitals and nursing note reviewed.  Constitutional:      General: She is not in acute distress.    Appearance: She is well-developed.  HENT:     Head: Normocephalic and atraumatic.     Mouth/Throat:     Dentition: Normal dentition.     Pharynx: Posterior oropharyngeal erythema present. No uvula swelling.     Tonsils: Tonsillar exudate present. 3+ on the right. 3+ on the left.  Eyes:     General:        Right eye: No discharge.        Left eye: No discharge.     Conjunctiva/sclera: Conjunctivae  normal.  Cardiovascular:     Rate and Rhythm: Normal rate and regular rhythm.  Pulmonary:     Effort: Pulmonary effort is normal. No respiratory distress.     Breath sounds: Normal breath sounds.  Abdominal:     Palpations: Abdomen is soft.     Tenderness: There is no abdominal tenderness.  Musculoskeletal:     Cervical back: Neck supple.  Lymphadenopathy:     Cervical: Cervical adenopathy present.  Skin:    General: Skin is warm and dry.  Neurological:     General: No focal deficit present.     Mental Status: She is alert and oriented to person, place, and time.    ED Results / Procedures / Treatments   Labs (all labs ordered are listed, but only abnormal results are displayed) Labs Reviewed  CBC WITH DIFFERENTIAL/PLATELET - Abnormal; Notable for the following components:      Result Value   WBC 12.7 (*)    RBC 5.26 (*)    MCV 79.8 (*)    MCH 25.7 (*)    Platelets 440 (*)    Neutro Abs 8.8 (*)    Abs Immature Granulocytes 0.08 (*)    All other components within normal limits  BASIC METABOLIC PANEL - Abnormal; Notable for the following components:   Sodium 133 (*)    Potassium 3.4 (*)    Chloride 97 (*)    Glucose, Bld 101 (*)    All other components within normal limits  POC SARS CORONAVIRUS 2 AG -  ED    EKG None  Radiology CT Soft Tissue Neck W Contrast  Result Date: 08/28/2020 CLINICAL DATA:  Tonsil/adenoid disorder. EXAM: CT NECK WITH CONTRAST TECHNIQUE: Multidetector CT imaging of the neck was performed using the standard protocol following the bolus administration of intravenous contrast. CONTRAST:  24mL OMNIPAQUE IOHEXOL 300 MG/ML  SOLN COMPARISON:  None. FINDINGS: Pharynx and larynx: Enlarged, heterogeneously enhancing tonsils bilaterally, compatible with tonsillitis. No discrete drainable fluid collection. Resulting narrowing of the oropharyngeal airway. Prominent adenoids. No evidence of retropharyngeal edema. Salivary glands: No inflammation, mass, or  stone. Thyroid: Normal. Lymph nodes: Marked bilateral cervical chain adenopathy without evidence of suppuration. Vascular: Grossly patent. Limited intracranial: Limited evaluation without evidence of acute abnormality. Visualized orbits: Not imaged. Mastoids and visualized paranasal sinuses: Clear. Skeleton: No evidence of acute abnormality. Upper chest: Heterogeneous stranding in the partially imaged anterior mediastinum, most likely residual thymus. Visualized lung apices are clear. IMPRESSION: 1. Enlarged, heterogeneously enhancing tonsils bilaterally, compatible with tonsillitis. No discrete drainable fluid  collection. Resulting narrowing of the oropharyngeal airway. 2. Prominent adenoids, most likely adenoiditis or adenoid hypertrophy. 3. Marked bilateral cervical adenopathy, nonspecific but most likely reactive given the above findings. Electronically Signed   By: Feliberto Harts MD   On: 08/28/2020 17:23   Procedures Procedures  Medications Ordered in ED Medications  ketorolac (TORADOL) injection 60 mg (has no administration in time range)  dexamethasone (DECADRON) injection 10 mg (10 mg Intravenous Given 08/28/20 1641)  clindamycin (CLEOCIN) IVPB 900 mg (0 mg Intravenous Stopped 08/28/20 1727)  lactated ringers bolus 1,000 mL (1,000 mLs Intravenous New Bag/Given 08/28/20 1737)  iohexol (OMNIPAQUE) 300 MG/ML solution 75 mL (75 mLs Intravenous Contrast Given 08/28/20 1708)    ED Course  I have reviewed the triage vital signs and the nursing notes.  Pertinent labs & imaging results that were available during my care of the patient were reviewed by me and considered in my medical decision making (see chart for details).    MDM Rules/Calculators/A&P                         Reviewed urgent care tests performed. Patient with a negative pregnancy test earlier this morning. Negative strep test. Culture sent.  CT neck with contrast ordered to evaluate for peritonsillar abscess.  Decadron given  for swelling and pain.  Patient given dose of IV clindamycin here. IVFs given since patient has been NPO.  Toradol IM given for pain.  Labs as above. Mild leukocytosis consistent for an acute infection. Mild electrolyte abnormalities consistent with decreased PO intake over last few days d/t symptoms. COVID negative.  CT neck without evidence for drainable fluid, no abscess. Patient with tonsillitis and adenoiditis.  Will prescribe clindamycin 450 3 times daily for the next 10 days for treatment of pharyngitis and adenoiditis.  Patient given strict return precautions to come back to the emergency department if she think she requires emergent care.  Stable for discharge home at this time. Follow up with PCP.  Final Clinical Impression(s) / ED Diagnoses Final diagnoses:  Pharyngitis, unspecified etiology  Adenoiditis    Rx / DC Orders ED Discharge Orders         Ordered    clindamycin (CLEOCIN) 150 MG capsule  3 times daily        08/28/20 1749           Kugler, [place], MD 08/28/20 2222    Virgina Norfolk, DO 08/28/20 2245

## 2020-08-28 NOTE — ED Provider Notes (Signed)
I have personally seen and examined the patient. I have reviewed the documentation on PMH/FH/Soc Hx. I have discussed the plan of care with the resident and patient.  I have reviewed and agree with the resident's documentation. Please see associated encounter note.  Briefly, the patient is a 25 y.o. female here with sore throat.  Negative Covid test and strep test already.  White count of 12.7.  Appears to have bad pharyngitis on exam.  Possibly could have a peritonsillar abscess or tonsillar abscess.  She has some lymphadenopathy.  Will get CT scan of the neck to rule out abscess.  Will give IV clindamycin, IV Decadron.  Suspect outpatient follow-up with ENT will be needed even if there is no abscess.  She has no difficulty with her secretions.  No trismus.  No major swelling of the lips of the tongue.  Please see my resident note for further results, evaluation, disposition of the patient.  This chart was dictated using voice recognition software.  Despite best efforts to proofread,  errors can occur which can change the documentation meaning.     EKG Interpretation None         Virgina Norfolk, DO 08/28/20 1707

## 2020-08-28 NOTE — ED Triage Notes (Signed)
Pt went to UC and was told to come here . Pt states she was dx w/ peritonsillar abscess

## 2020-08-28 NOTE — Discharge Instructions (Signed)
Go to the emergency department for evaluation of your peritonsillar abscess. 

## 2020-08-28 NOTE — ED Provider Notes (Signed)
[facility]    CSN: [id number] Arrival date & time: 08/28/20  0920      History   Chief Complaint Chief Complaint  Patient presents with  . Sore Throat    HPI Lorry Furber is a 25 y.o. female.   Patient presents with sore throat and painful, swollen tonsils with white patches x4 days.  She states it is very painful and difficult to swallow.  She had an E-visit this morning with a "teledoc" and was told to be seen in person for the possibility of tonsillar abscess; she was prescribed clindamycin and dexamethasone; she has not started these medications yet.  She states she felt warm yesterday but did not take her temperature but thinks she had a fever.  She denies rash, cough, shortness of breath, vomiting, diarrhea, or other symptoms.  Treatment attempted at home with OTC pain medications without relief.  The history is provided by the patient and medical records.    Past Medical History:  Diagnosis Date  . No pertinent past medical history     Patient Active Problem List   Diagnosis Date Noted  . 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 NO PAST SURGERIES      OB History    Gravida  1   Para      Term      Preterm      AB      Living        SAB      IAB      Ectopic      Multiple      Live Births               Home Medications    Prior to Admission medications   Medication Sig Start Date End Date Taking? Authorizing Provider  Multiple Vitamin (MULTIVITAMIN) tablet Take 1 tablet by mouth daily.   Yes [provider]    Family History Family History  Problem Relation Age of Onset  . Miscarriages / [place] Mother   . Diabetes Maternal Aunt   . Diabetes Maternal Uncle   . Cancer Maternal Grandfather     Social History Social History   Tobacco Use  . Smoking status: Never Smoker  . Smokeless tobacco: Never  Used  Vaping Use  . Vaping Use: Never used  Substance Use Topics  . Alcohol use: Not Currently  . Drug use: Never     Allergies   Patient has no known allergies.   Review of Systems Review of Systems  Constitutional: Positive for fever. Negative for chills.  HENT: Positive for sore throat and trouble swallowing. Negative for ear pain.   Eyes: Negative for pain and visual disturbance.  Respiratory: Negative for cough and shortness of breath.   Cardiovascular: Negative for chest pain and palpitations.  Gastrointestinal: Negative for abdominal pain, diarrhea and vomiting.  Genitourinary: Negative for dysuria and hematuria.  Musculoskeletal: Negative for arthralgias and back pain.  Skin: Negative for color change and rash.  Neurological: Negative for seizures and syncope.  All other systems reviewed and are negative.    Physical Exam Triage Vital Signs ED Triage Vitals  Enc Vitals Group     BP      Pulse      Resp      Temp      Temp src      SpO2  Weight      Height      Head Circumference      Peak Flow      Pain Score      Pain Loc      Pain Edu?      Excl. in [place]?    No data found.  Updated Vital Signs BP (!) 146/106 (BP Location: Left Wrist)   Pulse (!) 127   Temp 98.9 F (37.2 C) (Oral)   Resp 20   LMP  (Within Months) Comment: 3 months  SpO2 96%   Visual Acuity Right Eye Distance:   Left Eye Distance:   Bilateral Distance:    Right Eye Near:   Left Eye Near:    Bilateral Near:     Physical Exam Vitals and nursing note reviewed.  Constitutional:      General: She is not in acute distress.    Appearance: She is well-developed. She is ill-appearing.  HENT:     Head: Normocephalic and atraumatic.     Right Ear: Tympanic membrane normal.     Left Ear: Tympanic membrane normal.     Nose: Nose normal.     Mouth/Throat:     Mouth: Mucous membranes are moist.     Pharynx: Posterior oropharyngeal erythema present.     Tonsils: Tonsillar  exudate and tonsillar abscess present. 4+ on the right. 3+ on the left.     Comments: Right tonsil red, enlarged, crosses midline.  Left tonsil enlarged and red. Eyes:     Conjunctiva/sclera: Conjunctivae normal.  Cardiovascular:     Rate and Rhythm: Normal rate and regular rhythm.     Heart sounds: Normal heart sounds.  Pulmonary:     Effort: Pulmonary effort is normal. No respiratory distress.     Breath sounds: Normal breath sounds.  Abdominal:     Palpations: Abdomen is soft.     Tenderness: There is no abdominal tenderness.  Musculoskeletal:     Cervical back: Neck supple.  Skin:    General: Skin is warm and dry.     Findings: No rash.  Neurological:     General: No focal deficit present.     Mental Status: She is alert and oriented to person, place, and time.     Gait: Gait normal.  Psychiatric:        Mood and Affect: Mood normal.        Behavior: Behavior normal.      UC Treatments / Results  Labs (all labs ordered are listed, but only abnormal results are displayed) Labs Reviewed  CULTURE, GROUP A STREP [facility])  POCT RAPID STREP A, ED / UC    EKG   Radiology No results found.  Procedures Procedures (including critical care time)  Medications Ordered in UC Medications - No data to display  Initial Impression / Assessment and Plan / UC Course  I have reviewed the triage vital signs and the nursing notes.  Pertinent labs & imaging results that were available during my care of the patient were reviewed by me and considered in my medical decision making (see chart for details).   Peritonsillar abscess.  Tonsils touching; right tonsil crossing midline.  Patient reports difficulty swallowing.  She is tearful during exam.  She was prescribed clindamycin and dexamethasone on an E-visit this morning but has not started these prescriptions yet as she was instructed to be seen in person.  Sending her to the ED for evaluation.  Patient has a friend who  drove her here  and will drive her there.  She reports she is stable for POV.   Final Clinical Impressions(s) / UC Diagnoses   Final diagnoses:  Peritonsillar abscess     Discharge Instructions     Go to the emergency department for evaluation of your peritonsillar abscess.    ED Prescriptions    None     PDMP not reviewed this encounter.   Mickie Bail, NP 08/28/20 1030

## 2020-08-28 NOTE — Discharge Instructions (Signed)
Please return to the emergency department if you experience: You have a stiff neck. You drool or cannot swallow liquids. You cannot drink or take medicines without throwing up. You have very bad pain that does not go away with medicine. You have problems breathing, and it is not from a stuffy nose. You have new pain and swelling in your knees, ankles, wrists, or elbows.  There is an antibiotic prescription at the pharmacy. You may take ibuprofen and tylenol for pain control.

## 2020-08-31 LAB — CULTURE, GROUP A STREP (THRC)

## 2021-08-25 IMAGING — CT CT NECK W/ CM
4 of 5 series · 16 of 33 positions shown, 18 images · IV contrast (APPLIED)
Comparison: None.

CLINICAL DATA: Tonsil/adenoid disorder.

EXAM:
CT NECK WITH CONTRAST
TECHNIQUE: Multidetector CT imaging of the neck was performed using the
standard protocol following the bolus administration of intravenous
contrast.
CONTRAST:  75mL OMNIPAQUE IOHEXOL 300 MG/ML  SOLN

[Series 3: neck 2.0 i31s 3 · axial · 0.51mm/px · z∈[-244,-126]mm · 4 of 99 slices shown, 5 images]
[im 20/99  soft-tissue]
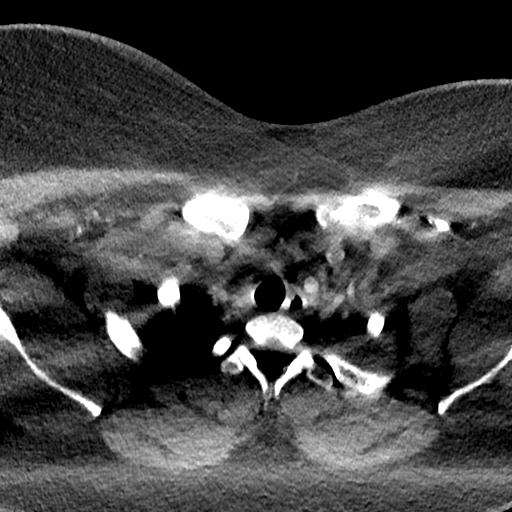
[im 20/99  bone]
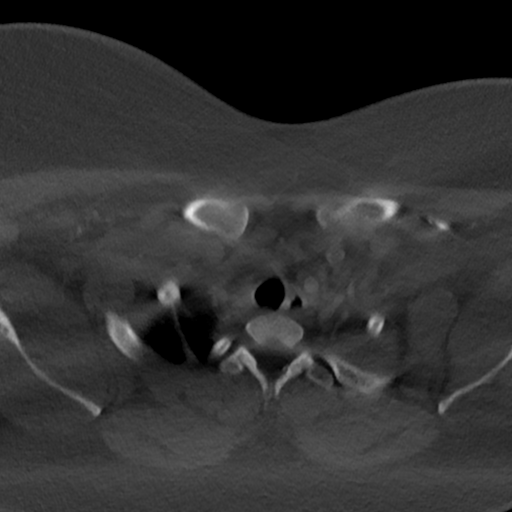
[im 40/99  bone]
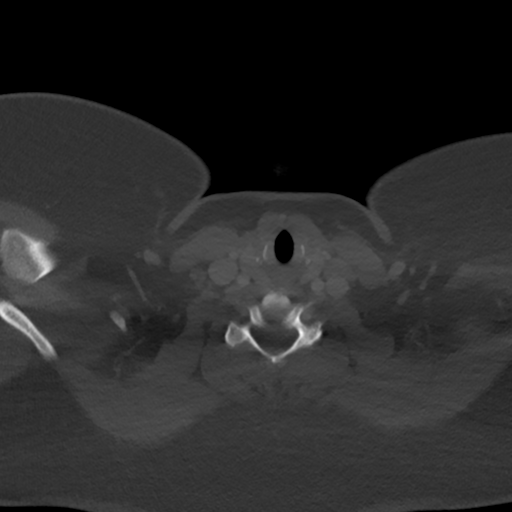
[im 59/99  bone]
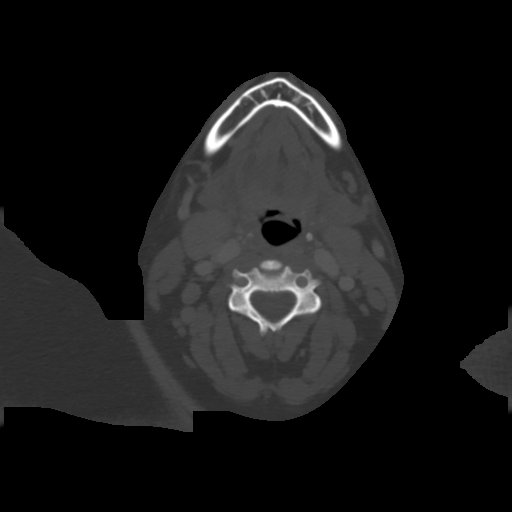
[im 79/99  bone]
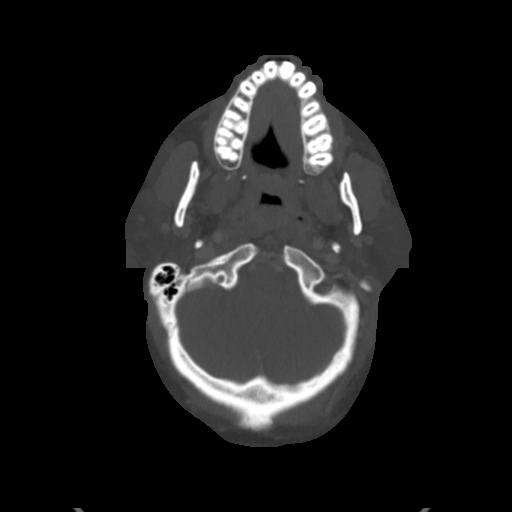

[Series 7: coronal st · coronal · 0.36mm/px · 3 of 101 slices shown]
[im 21/101  bone]
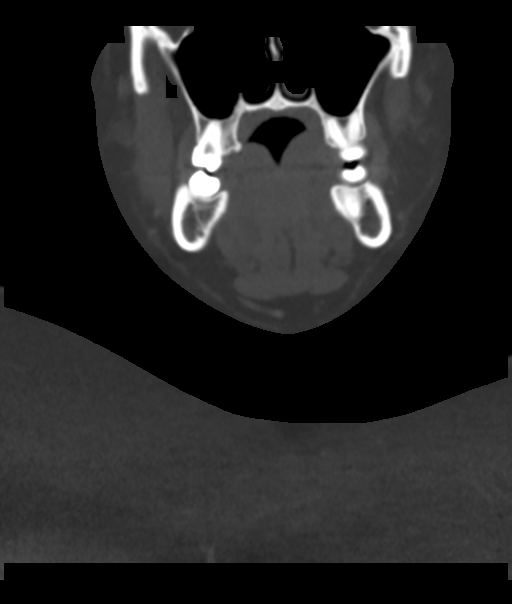
[im 41/101  bone]
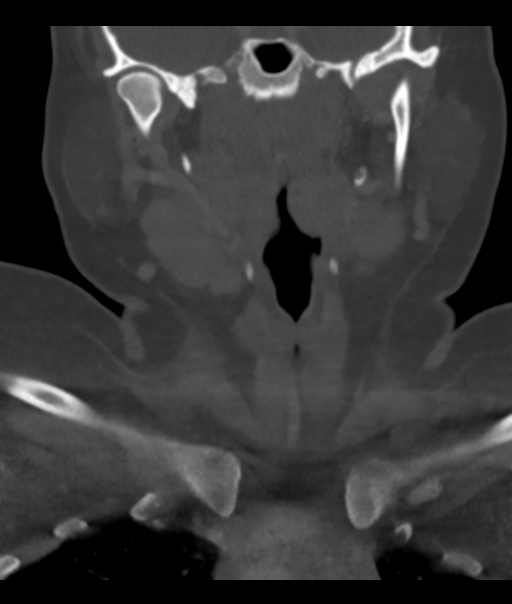
[im 61/101  bone]
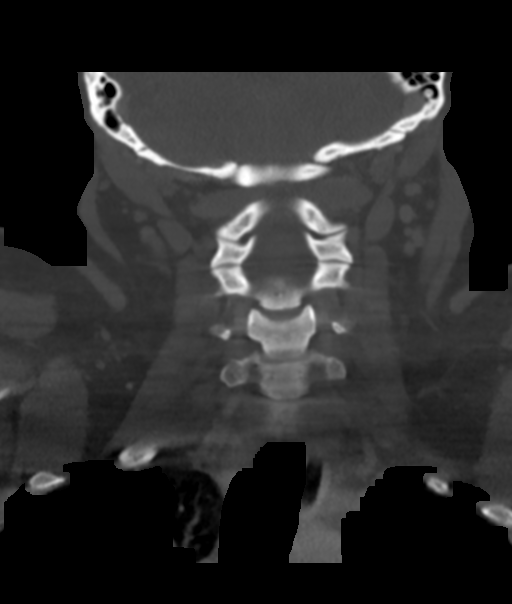

[Series 8: sagittal st · sagittal · 0.43mm/px · 5 of 101 slices shown, 6 images]
[im 34/101  bone]
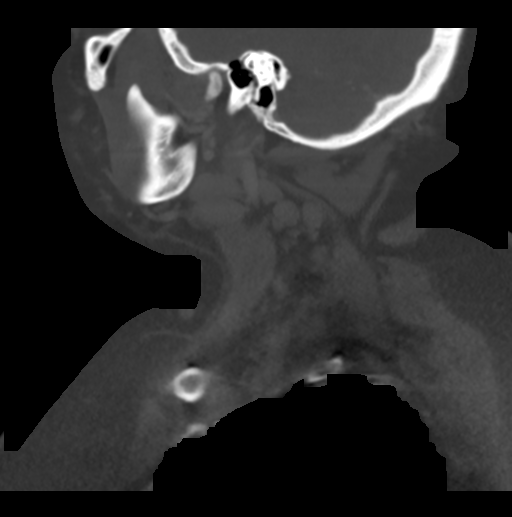
[im 42/101  bone]
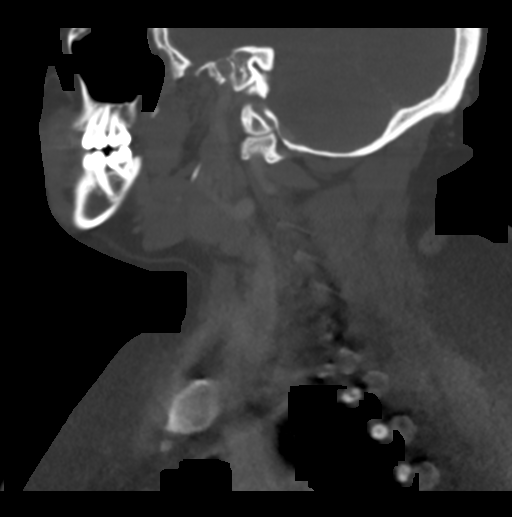
[im 51/101  soft-tissue]
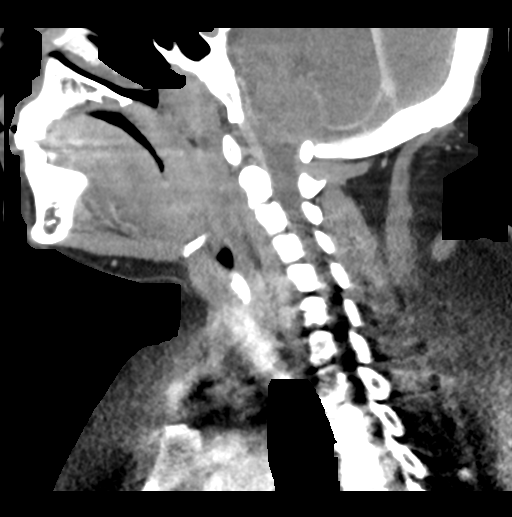
[im 51/101  bone]
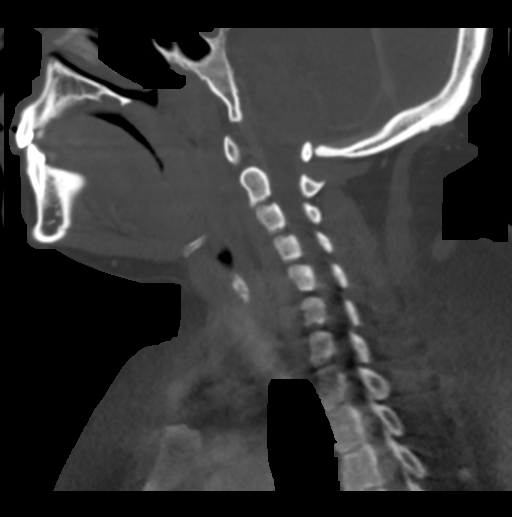
[im 59/101  bone]
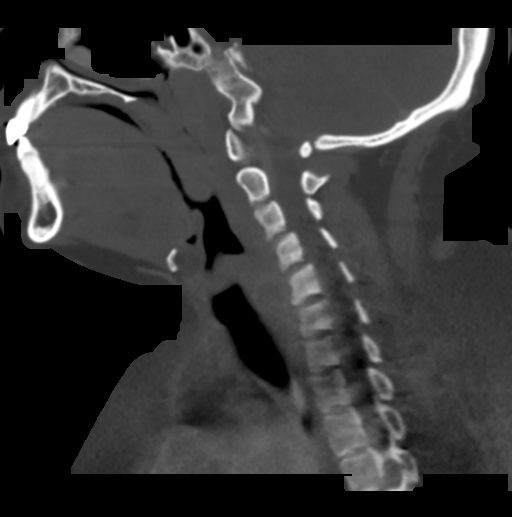
[im 67/101  bone]
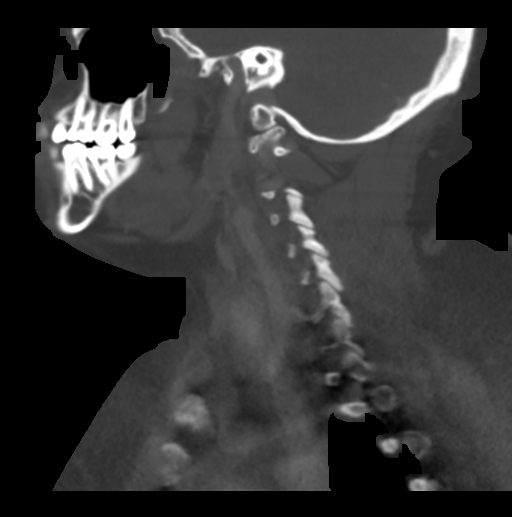

[Series 9: orthogonal st · axial · 0.39mm/px · z∈[-245,-127]mm · 4 of 99 slices shown]
[im 20/99  bone]
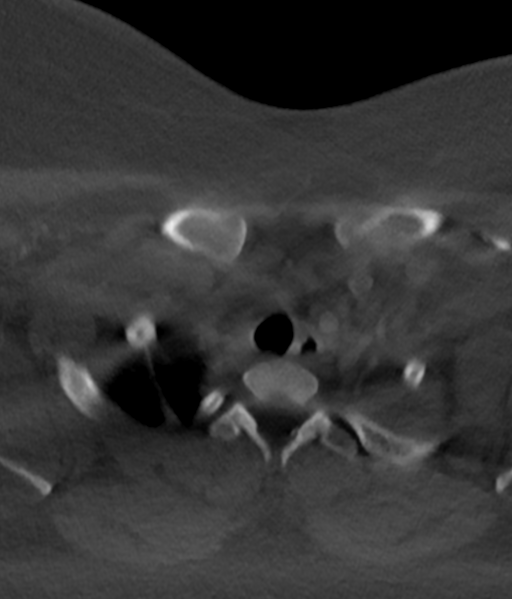
[im 40/99  bone]
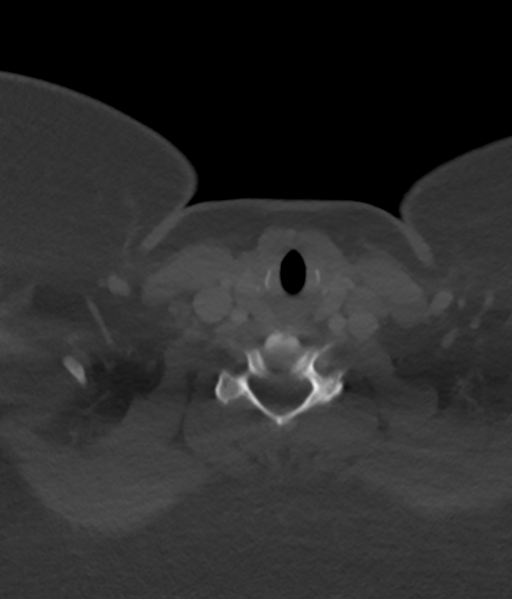
[im 59/99  bone]
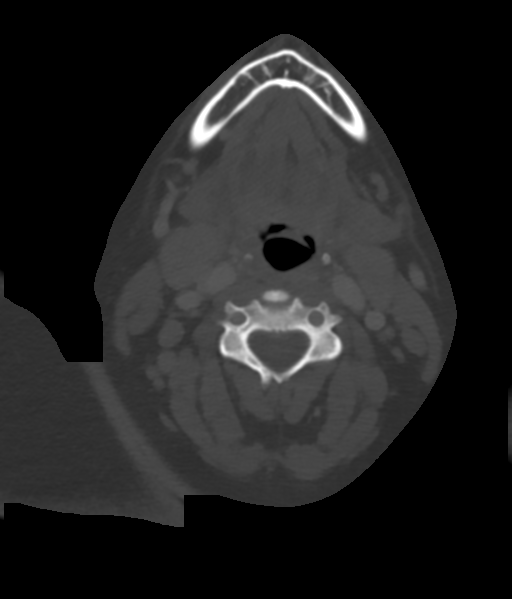
[im 79/99  bone]
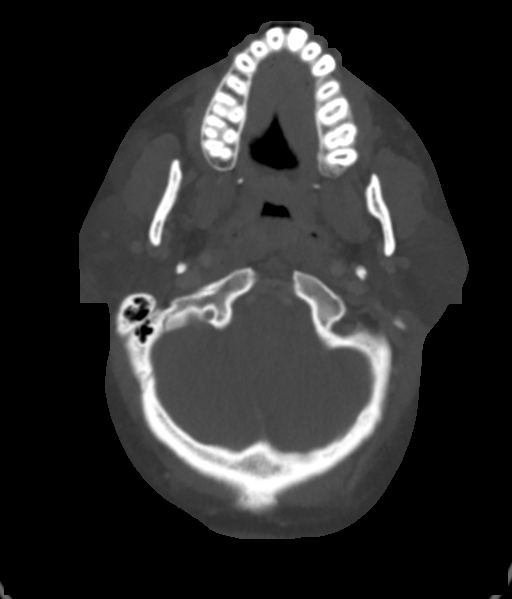

[16 of 33 positions shown; findings below may reference images not displayed]

FINDINGS: Pharynx and larynx: Enlarged, heterogeneously enhancing tonsils
bilaterally, compatible with tonsillitis. No discrete drainable
fluid collection. Resulting narrowing of the oropharyngeal airway.
Prominent adenoids. No evidence of retropharyngeal edema.

Salivary glands: No inflammation, mass, or stone.

Thyroid: Normal.

Lymph nodes: Marked bilateral cervical chain adenopathy without
evidence of suppuration.

Vascular: Grossly patent.

Limited intracranial: Limited evaluation without evidence of acute
abnormality.

Visualized orbits: Not imaged.

Mastoids and visualized paranasal sinuses: Clear.

Skeleton: No evidence of acute abnormality.

Upper chest: Heterogeneous stranding in the partially imaged
anterior mediastinum, most likely residual thymus. Visualized lung
apices are clear.
IMPRESSION: 1. Enlarged, heterogeneously enhancing tonsils bilaterally,
compatible with tonsillitis. No discrete drainable fluid collection.
Resulting narrowing of the oropharyngeal airway.
2. Prominent adenoids, most likely adenoiditis or adenoid
hypertrophy.
3. Marked bilateral cervical adenopathy, nonspecific but most likely
reactive given the above findings.

## 2021-11-10 ENCOUNTER — Other Ambulatory Visit: Payer: Self-pay

## 2021-11-10 ENCOUNTER — Encounter (HOSPITAL_COMMUNITY): Payer: Self-pay | Admitting: Emergency Medicine

## 2021-11-10 ENCOUNTER — Emergency Department (HOSPITAL_COMMUNITY)
Admission: EM | Admit: 2021-11-10 | Discharge: 2021-11-11 | Disposition: A | Discharge status: Home / Self Care | Payer: Managed Care, Other (non HMO) | Attending: Emergency Medicine | Admitting: Emergency Medicine

## 2021-11-10 DIAGNOSIS — M25571 Pain in right ankle and joints of right foot: Secondary | ICD-10-CM | POA: Insufficient documentation

## 2021-11-10 DIAGNOSIS — M542 Cervicalgia: Secondary | ICD-10-CM | POA: Insufficient documentation

## 2021-11-10 DIAGNOSIS — Y92410 Unspecified street and highway as the place of occurrence of the external cause: Secondary | ICD-10-CM | POA: Diagnosis not present

## 2021-11-10 DIAGNOSIS — S8992XA Unspecified injury of left lower leg, initial encounter: Secondary | ICD-10-CM | POA: Diagnosis present

## 2021-11-10 DIAGNOSIS — M549 Dorsalgia, unspecified: Secondary | ICD-10-CM | POA: Diagnosis not present

## 2021-11-10 DIAGNOSIS — S8012XA Contusion of left lower leg, initial encounter: Secondary | ICD-10-CM | POA: Diagnosis not present
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
## 2021-11-11 ENCOUNTER — Emergency Department (HOSPITAL_COMMUNITY): Payer: Managed Care, Other (non HMO)

## 2021-11-11 MED ORDER — IBUPROFEN 600 MG PO TABS: 600.0000 mg | ORAL_TABLET | Freq: Four times a day (QID) | ORAL | 0 refills | Status: AC | PRN

## 2021-11-11 MED ORDER — CYCLOBENZAPRINE HCL 10 MG PO TABS: 10.0000 mg | ORAL_TABLET | Freq: Three times a day (TID) | ORAL | 0 refills | Status: AC | PRN
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
## 2021-11-11 NOTE — ED Notes (Signed)
DC instructions reviewed with pt. PT verbalized understanding. PT DC °

## 2021-12-31 ENCOUNTER — Encounter (HOSPITAL_COMMUNITY): Payer: Self-pay | Admitting: Emergency Medicine

## 2021-12-31 ENCOUNTER — Ambulatory Visit (HOSPITAL_COMMUNITY)
Admission: EM | Admit: 2021-12-31 | Discharge: 2021-12-31 | Disposition: A | Discharge status: Home / Self Care | Payer: Managed Care, Other (non HMO) | Attending: Emergency Medicine | Admitting: Emergency Medicine

## 2021-12-31 DIAGNOSIS — J069 Acute upper respiratory infection, unspecified: Secondary | ICD-10-CM

## 2021-12-31 DIAGNOSIS — F17200 Nicotine dependence, unspecified, uncomplicated: Secondary | ICD-10-CM | POA: Diagnosis not present

## 2021-12-31 DIAGNOSIS — H9203 Otalgia, bilateral: Secondary | ICD-10-CM

## 2021-12-31 MED ORDER — PREDNISONE 10 MG PO TABS: 40.0000 mg | ORAL_TABLET | Freq: Every day | ORAL | 0 refills | Status: AC

## 2021-12-31 NOTE — ED Provider Notes (Signed)
[facility]    CSN: [id number] Arrival date & time: 12/31/21  1628      History   Chief Complaint Chief Complaint  Patient presents with   Otalgia    HPI Gail [name] is a 26 y.o. female.   26 year old female pt, Gail [name], presents to  Urgent care with chief complaint of bialteral ear pain, had virtual visit 2 days prior was started on Augmentin and feels worse. Pt smokes 1ppd, taking Mucinex OTC without relief.  Pt endorses using q tips.  The history is provided by the patient. No language interpreter was used.    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 Authorizing Provider  predniSONE (DELTASONE) 10 MG tablet Take 4 tablets (40 mg total) by mouth daily for 3 days. 12/31/21 01/03/22 Yes Wanette Robison, Para March, NP  cyclobenzaprine (FLEXERIL) 10 MG tablet Take 1 tablet (10 mg total) by mouth 3 (three) times daily as needed for muscle spasms. 11/11/21   Roxy Horseman, PA-C  ibuprofen (ADVIL) 600 MG tablet Take 1 tablet (600 mg total) by mouth every 6 (six) hours as needed. 11/11/21   Roxy Horseman, PA-C  Multiple Vitamin (MULTIVITAMIN) tablet Take 1 tablet by mouth daily.    [provider]    Family History Family History  Problem Relation Age of Onset   Miscarriages / [place] Mother    Cancer Maternal Grandfather     Diabetes Maternal Aunt    Diabetes Maternal Uncle     Social History Social History   Tobacco Use   Smoking status: Never   Smokeless tobacco: Never  Vaping Use   Vaping Use: Never used  Substance Use Topics   Alcohol use: Not Currently   Drug use: Never     Allergies   Patient has no known allergies.   Review of Systems Review of Systems  HENT:  Positive for congestion, ear pain and sore throat. Negative for ear discharge.   Respiratory:  Positive for cough.   Gastrointestinal:  Negative for abdominal pain, nausea and vomiting.  Genitourinary:  Negative for dysuria.  Musculoskeletal:  Negative for back pain.  All other systems reviewed and are negative.    Physical Exam Triage Vital Signs ED Triage Vitals  Enc Vitals Group     BP 12/31/21 1655 (!) 165/118     Pulse Rate 12/31/21 1655 85     Resp 12/31/21 1655 18     Temp 12/31/21 1655 98.5 F (36.9 C)  Temp Source 12/31/21 1655 Oral     SpO2 12/31/21 1655 98 %     Weight 12/31/21 1657 270 lb (122.5 kg)     Height 12/31/21 1657 5\' 3"  (1.6 m)     Head Circumference --      Peak Flow --      Pain Score 12/31/21 1656 8     Pain Loc --      Pain Edu? --      Excl. in [place]? --    No data found.  Updated Vital Signs BP (!) 165/118 (BP Location: Left Arm)   Pulse 85   Temp 98.5 F (36.9 C) (Oral)   Resp 18   Ht 5\' 3"  (1.6 m)   Wt 270 lb (122.5 kg)   SpO2 98%   Breastfeeding No   BMI 47.83 kg/m   Visual Acuity Right Eye Distance:   Left Eye Distance:   Bilateral Distance:    Right Eye Near:   Left Eye Near:    Bilateral Near:     Physical Exam Vitals and nursing note reviewed.  Constitutional:      General: She is not in acute distress.    Appearance: She is well-developed.  HENT:     Head: Normocephalic.     Right Ear: Tympanic membrane is retracted.     Left Ear: Tympanic membrane is retracted.     Nose: Congestion present.     Mouth/Throat:     Lips: Pink.     Mouth: Mucous  membranes are moist.     Pharynx: Oropharynx is clear.  Eyes:     General: Lids are normal.     Conjunctiva/sclera: Conjunctivae normal.     Pupils: Pupils are equal, round, and reactive to light.  Neck:     Trachea: No tracheal deviation.  Cardiovascular:     Rate and Rhythm: Regular rhythm.     Pulses: Normal pulses.     Heart sounds: Normal heart sounds. No murmur heard. Pulmonary:     Effort: Pulmonary effort is normal.     Breath sounds: Normal breath sounds and air entry.  Abdominal:     General: Bowel sounds are normal.     Palpations: Abdomen is soft.     Tenderness: There is no abdominal tenderness.  Musculoskeletal:        General: Normal range of motion.     Cervical back: Normal range of motion.  Lymphadenopathy:     Cervical: No cervical adenopathy.  Skin:    General: Skin is warm and dry.     Findings: No rash.  Neurological:     General: No focal deficit present.     Mental Status: She is alert and oriented to person, place, and time.     GCS: GCS eye subscore is 4. GCS verbal subscore is 5. GCS motor subscore is 6.  Psychiatric:        Speech: Speech normal.        Behavior: Behavior normal. Behavior is cooperative.      UC Treatments / Results  Labs (all labs ordered are listed, but only abnormal results are displayed) Labs Reviewed - No data to display  EKG   Radiology No results found.  Procedures Procedures (including critical care time)  Medications Ordered in UC Medications - No data to display  Initial Impression / Assessment and Plan / UC Course  I have reviewed the triage vital signs and the nursing notes.  Pertinent labs & imaging  results that were available during my care of the patient were reviewed by me and considered in my medical decision making (see chart for details).     Ddx: smoker, Viral URI,seasonal allergies Final Clinical Impressions(s) / UC Diagnoses   Final diagnoses:  Smoker  Acute URI  Otalgia of both ears      Discharge Instructions      Take prednisone as directed, use flonase as directed over the counter, take Coricidin HBP as directed over the counter, may use chloraseptic throat lozenges while awake, rest,push fluids. Avoid water in ears, no qtips. Stop taking generic mucinex and smoking as your BP is elevated. Warm salt water gargles several times daily. Take augmentin as prescribed.      ED Prescriptions     Medication Sig Dispense Auth. Provider   predniSONE (DELTASONE) 10 MG tablet Take 4 tablets (40 mg total) by mouth daily for 3 days. 12 tablet Yuto Cajuste, Para March, NP      PDMP not reviewed this encounter.   Clancy Gourd, NP 12/31/21 [phone]

## 2021-12-31 NOTE — ED Triage Notes (Signed)
Patient c/o bilateral ear pain, sore throat, congestion, cough x 4 days.  Patient did a virtual visit on Monday prescribed Amoxil 875mg  bid #14.  Patient feels worse.

## 2021-12-31 NOTE — Discharge Instructions (Addendum)
Take prednisone as directed, use flonase as directed over the counter, take Coricidin HBP as directed over the counter, may use chloraseptic throat lozenges while awake, rest,push fluids. Avoid water in ears, no qtips. Stop taking generic mucinex and smoking as your BP is elevated. Warm salt water gargles several times daily. Take augmentin as prescribed.

## 2022-03-16 ENCOUNTER — Encounter (INDEPENDENT_AMBULATORY_CARE_PROVIDER_SITE_OTHER): Payer: Self-pay | Admitting: Internal Medicine

## 2022-03-16 DIAGNOSIS — Z0289 Encounter for other administrative examinations: Secondary | ICD-10-CM
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
